# Patient Record
Sex: Female | Born: 1990
Health system: Southern US, Community
[De-identification: ages and names within clinical notes are randomized; demographics above are authoritative.]

## PROBLEM LIST (undated history)

## (undated) DIAGNOSIS — F909 Attention-deficit hyperactivity disorder, unspecified type: Secondary | ICD-10-CM

## (undated) DIAGNOSIS — R102 Pelvic and perineal pain: Secondary | ICD-10-CM

## (undated) DIAGNOSIS — K219 Gastro-esophageal reflux disease without esophagitis: Secondary | ICD-10-CM

## (undated) DIAGNOSIS — F419 Anxiety disorder, unspecified: Secondary | ICD-10-CM

## (undated) DIAGNOSIS — Z8719 Personal history of other diseases of the digestive system: Secondary | ICD-10-CM

## (undated) DIAGNOSIS — N809 Endometriosis, unspecified: Secondary | ICD-10-CM

## (undated) DIAGNOSIS — O09299 Supervision of pregnancy with other poor reproductive or obstetric history, unspecified trimester: Secondary | ICD-10-CM

## (undated) DIAGNOSIS — L309 Dermatitis, unspecified: Secondary | ICD-10-CM

## (undated) DIAGNOSIS — Z8711 Personal history of peptic ulcer disease: Secondary | ICD-10-CM

## (undated) DIAGNOSIS — N83209 Unspecified ovarian cyst, unspecified side: Secondary | ICD-10-CM

## (undated) HISTORY — DX: Anxiety disorder, unspecified: F41.9

## (undated) HISTORY — DX: Personal history of peptic ulcer disease: Z87.11

## (undated) HISTORY — DX: Attention-deficit hyperactivity disorder, unspecified type: F90.9

## (undated) HISTORY — PX: WISDOM TOOTH EXTRACTION: SHX21

## (undated) HISTORY — PX: LAPAROSCOPIC CHOLECYSTECTOMY: SUR755

## (undated) HISTORY — DX: Personal history of other diseases of the digestive system: Z87.19

---

## 2015-12-13 ENCOUNTER — Encounter: Payer: Self-pay | Admitting: Obstetrics and Gynecology

## 2015-12-19 ENCOUNTER — Encounter: Payer: Self-pay | Admitting: Obstetrics and Gynecology

## 2015-12-19 ENCOUNTER — Ambulatory Visit (INDEPENDENT_AMBULATORY_CARE_PROVIDER_SITE_OTHER): Payer: Commercial Managed Care - HMO | Admitting: Obstetrics and Gynecology

## 2015-12-19 VITALS — BP 118/70 | HR 80 | Resp 16 | Ht 67.0 in | Wt 148.0 lb

## 2015-12-19 DIAGNOSIS — Z113 Encounter for screening for infections with a predominantly sexual mode of transmission: Secondary | ICD-10-CM

## 2015-12-19 DIAGNOSIS — Z3009 Encounter for other general counseling and advice on contraception: Secondary | ICD-10-CM | POA: Diagnosis not present

## 2015-12-19 DIAGNOSIS — F419 Anxiety disorder, unspecified: Secondary | ICD-10-CM | POA: Insufficient documentation

## 2015-12-19 DIAGNOSIS — Z0000 Encounter for general adult medical examination without abnormal findings: Secondary | ICD-10-CM | POA: Diagnosis not present

## 2015-12-19 DIAGNOSIS — Z01419 Encounter for gynecological examination (general) (routine) without abnormal findings: Secondary | ICD-10-CM | POA: Diagnosis not present

## 2015-12-19 DIAGNOSIS — Z124 Encounter for screening for malignant neoplasm of cervix: Secondary | ICD-10-CM

## 2015-12-19 LAB — CBC
HEMATOCRIT: 41.4 % (ref 35.0–45.0)
HEMOGLOBIN: 13.9 g/dL (ref 11.7–15.5)
MCH: 31.9 pg (ref 27.0–33.0)
MCHC: 33.6 g/dL (ref 32.0–36.0)
MCV: 95 fL (ref 80.0–100.0)
MPV: 10.5 fL (ref 7.5–12.5)
Platelets: 235 10*3/uL (ref 140–400)
RBC: 4.36 MIL/uL (ref 3.80–5.10)
RDW: 13.3 % (ref 11.0–15.0)
WBC: 8.9 10*3/uL (ref 3.8–10.8)

## 2015-12-19 MED ORDER — NAPROXEN SODIUM 550 MG PO TABS: 550.0000 mg | ORAL_TABLET | Freq: Two times a day (BID) | ORAL | 2 refills | Status: DC

## 2015-12-19 MED ORDER — NORGESTIM-ETH ESTRAD TRIPHASIC 0.18/0.215/0.25 MG-25 MCG PO TABS: 1.0000 | ORAL_TABLET | Freq: Every day | ORAL | 3 refills | Status: DC

## 2015-12-19 NOTE — Progress Notes (Addendum)
25 y.o. G0P0000 MarriedCaucasianF here for annual exam.  She is interested in contraception. She did well on OTC-Lo in the past. She ran out about 6 months ago.  Period Cycle (Days): 28 Period Duration (Days): 3-4 days  Period Pattern: Regular Menstrual Flow: Moderate Menstrual Control: Tampon Dysmenorrhea: (!) Moderate Dysmenorrhea Symptoms: Cramping  Saturates a super tampon in 4-6 hours at times. Cramps are helped with midol.  She c/o intermittent pain in her left breast for the last year. All in the upper outer side, not cyclic. Can last for several hours at a time. She drinks caffeine, 2 cups of coffee a day (10 oz each)   Patient's last menstrual period was 12/10/2015.          Sexually active: Yes.    The current method of family planning is none.    Exercising: Yes.    plyometrics weights  Smoker:  Former smoker  Health Maintenance: Pap:  2015 History of abnormal Pap:  no MMG:  Never Colonoscopy:  2016 WNL per patient  BMD:   Never TDaP:  2015 Gardasil: no    reports that she has quit smoking. Her smoking use included Cigarettes. She has a 2.50 pack-year smoking history. She has never used smokeless tobacco. She reports that she drinks about 1.2 oz of alcohol per week . 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 She is applying to nursing school.   Past Medical History:  Diagnosis Date  . Anxiety   . History of gastric ulcer     Past Surgical History:  Procedure Laterality Date  . CHOLECYSTECTOMY      Current Outpatient Prescriptions  Medication Sig Dispense Refill  . Crisaborole (EUCRISA) 2 % OINT Apply topically 2 (two) times daily.    Marland Kitchen loratadine (CLARITIN) 10 MG tablet Take 10 mg by mouth daily.     No current facility-administered medications for this visit.     Family History  Problem Relation Age of Onset  . Hypertension Mother   . Hyperlipidemia Father   . Heart disease Maternal Grandmother     Review of  Systems  Constitutional: Negative.   HENT: Negative.   Eyes: Negative.   Respiratory: Negative.   Cardiovascular: Negative.   Gastrointestinal: Negative.   Endocrine: Negative.   Genitourinary: Negative.        Left breast pain   Musculoskeletal: Negative.   Skin: Negative.   Allergic/Immunologic: Negative.   Neurological: Negative.   Psychiatric/Behavioral: Negative.     Exam:   BP 118/70 (BP Location: Right Arm, Patient Position: Sitting, Cuff Size: Normal)   Pulse 80   Resp 16   Ht 5\' 7"  (1.702 m)   Wt 148 lb (67.1 kg)   LMP 12/10/2015   BMI 23.18 kg/m   Weight change: @WEIGHTCHANGE @ Height:   Height: 5\' 7"  (170.2 cm)  Ht Readings from Last 3 Encounters:  12/19/15 5\' 7"  (1.702 m)    General appearance: alert, cooperative and appears stated age Head: Normocephalic, without obvious abnormality, atraumatic Neck: no adenopathy, supple, symmetrical, trachea midline and thyroid normal to inspection and palpation Lungs: clear to auscultation bilaterally Breasts: normal appearance, no masses or tenderness Heart: regular rate and rhythm Abdomen: soft, non-tender; bowel sounds normal; no masses,  no organomegaly Extremities: extremities normal, atraumatic, no cyanosis or edema Skin: Skin color, texture, turgor normal. No rashes or lesions Lymph nodes: Cervical, supraclavicular, and axillary nodes normal. No abnormal inguinal nodes palpated Neurologic: Grossly normal  Pelvic: External genitalia:  no lesions              Urethra:  normal appearing urethra with no masses, tenderness or lesions              Bartholins and Skenes: normal                 Vagina: normal appearing vagina with normal color and discharge, no lesions              Cervix: no lesions               Bimanual Exam:  Uterus:  normal size, contour, position, consistency, mobility, non-tender, anteverted              Adnexa: no mass, fullness, tenderness               Rectovaginal: Confirms                Anus:  normal sphincter tone, no lesions  Chaperone was present for exam.  A:  Well Woman with normal exam  Contraception   Dysmenorrhea   Screening for STD  P:   Pap with hpv  Start OCP's, no contraindications, risks reviewed   Anaprox DS for dysmorrhea  Discussed breast self exam  Discussed calcium and vit D intake  Labs done     Addendum: left sided breast pain, normal exam. Advised her to stop caffeine, if breast pain persists she will call and we will set up a breast ultrasound for her.

## 2015-12-19 NOTE — Patient Instructions (Signed)
EXERCISE AND DIET:  We recommended that you start or continue a regular exercise program for good health. Regular exercise means any activity that makes your heart beat faster and makes you sweat.  We recommend exercising at least 30 minutes per day at least 3 days a week, preferably 4 or 5.  We also recommend a diet low in fat and sugar.  Inactivity, poor dietary choices and obesity can cause diabetes, heart attack, stroke, and kidney damage, among others.    ALCOHOL AND SMOKING:  Women should limit their alcohol intake to no more than 7 drinks/beers/glasses of wine (combined, not each!) per week. Moderation of alcohol intake to this level decreases your risk of breast cancer and liver damage. And of course, no recreational drugs are part of a healthy lifestyle.  And absolutely no smoking or even second hand smoke. Most people know smoking can cause heart and lung diseases, but did you know it also contributes to weakening of your bones? Aging of your skin?  Yellowing of your teeth and nails?  CALCIUM AND VITAMIN D:  Adequate intake of calcium and Vitamin D are recommended.  The recommendations for exact amounts of these supplements seem to change often, but generally speaking 600 mg of calcium (either carbonate or citrate) and 800 units of Vitamin D per day seems prudent. Certain women may benefit from higher intake of Vitamin D.  If you are among these women, your doctor will have told you during your visit.    PAP SMEARS:  Pap smears, to check for cervical cancer or precancers,  have traditionally been done yearly, although recent scientific advances have shown that most women can have pap smears less often.  However, every woman still should have a physical exam from her gynecologist every year. It will include a breast check, inspection of the vulva and vagina to check for abnormal growths or skin changes, a visual exam of the cervix, and then an exam to evaluate the size and shape of the uterus and  ovaries.  And after 26 years of age, a rectal exam is indicated to check for rectal cancers. We will also provide age appropriate advice regarding health maintenance, like when you should have certain vaccines, screening for sexually transmitted diseases, bone density testing, colonoscopy, mammograms, etc.   MAMMOGRAMS:  All women over 40 years old should have a yearly mammogram. Many facilities now offer a "3D" mammogram, which may cost around $50 extra out of pocket. If possible,  we recommend you accept the option to have the 3D mammogram performed.  It both reduces the number of women who will be called back for extra views which then turn out to be normal, and it is better than the routine mammogram at detecting truly abnormal areas.    COLONOSCOPY:  Colonoscopy to screen for colon cancer is recommended for all women at age 50.  We know, you hate the idea of the prep.  We agree, BUT, having colon cancer and not knowing it is worse!!  Colon cancer so often starts as a polyp that can be seen and removed at colonscopy, which can quite literally save your life!  And if your first colonoscopy is normal and you have no family history of colon cancer, most women don't have to have it again for 10 years.  Once every ten years, you can do something that may end up saving your life, right?  We will be happy to help you get it scheduled when you are ready.    Be sure to check your insurance coverage so you understand how much it will cost.  It may be covered as a preventative service at no cost, but you should check your particular policy.     Oral Contraception Information Oral contraceptive pills (OCPs) are medicines taken to prevent pregnancy. OCPs work by preventing the ovaries from releasing eggs. The hormones in OCPs also cause the cervical mucus to thicken, preventing the sperm from entering the uterus. The hormones also cause the uterine lining to become thin, not allowing a fertilized egg to attach to the  inside of the uterus. OCPs are highly effective when taken exactly as prescribed. However, OCPs do not prevent sexually transmitted diseases (STDs). Safe sex practices, such as using condoms along with the pill, can help prevent STDs.  Before taking the pill, you may have a physical exam and Pap test. Your health care provider may order blood tests. The health care provider will make sure you are a good candidate for oral contraception. Discuss with your health care provider the possible side effects of the OCP you may be prescribed. When starting an OCP, it can take 2 to 3 months for the body to adjust to the changes in hormone levels in your body.  TYPES OF ORAL CONTRACEPTION  The combination pill--This pill contains estrogen and progestin (synthetic progesterone) hormones. The combination pill comes in 21-day, 28-day, or 91-day packs. Some types of combination pills are meant to be taken continuously (365-day pills). With 21-day packs, you do not take pills for 7 days after the last pill. With 28-day packs, the pill is taken every day. The last 7 pills are without hormones. Certain types of pills have more than 21 hormone-containing pills. With 91-day packs, the first 84 pills contain both hormones, and the last 7 pills contain no hormones or contain estrogen only.  The minipill--This pill contains the progesterone hormone only. The pill is taken every day continuously. It is very important to take the pill at the same time each day. The minipill comes in packs of 28 pills. All 28 pills contain the hormone.  ADVANTAGES OF ORAL CONTRACEPTIVE PILLS  Decreases premenstrual symptoms.   Treats menstrual period cramps.   Regulates the menstrual cycle.   Decreases a heavy menstrual flow.   May treatacne, depending on the type of pill.   Treats abnormal uterine bleeding.   Treats polycystic ovarian syndrome.   Treats endometriosis.   Can be used as emergency contraception.  THINGS  THAT CAN MAKE ORAL CONTRACEPTIVE PILLS LESS EFFECTIVE OCPs can be less effective if:   You forget to take the pill at the same time every day.   You have a stomach or intestinal disease that lessens the absorption of the pill.   You take OCPs with other medicines that make OCPs less effective, such as antibiotics, certain HIV medicines, and some seizure medicines.   You take expired OCPs.   You forget to restart the pill on day 7, when using the packs of 21 pills.  RISKS ASSOCIATED WITH ORAL CONTRACEPTIVE PILLS  Oral contraceptive pills can sometimes cause side effects, such as:  Headache.  Nausea.  Breast tenderness.  Irregular bleeding or spotting. Combination pills are also associated with a small increased risk of:  Blood clots.  Heart attack.  Stroke.   This information is not intended to replace advice given to you by your health care provider. Make sure you discuss any questions you have with your health care provider.  Document Released: 07/28/2002 Document Revised: 02/25/2013 Document Reviewed: 10/26/2012 [employer] Interactive Patient Education [employer].

## 2015-12-20 LAB — COMPREHENSIVE METABOLIC PANEL
ALBUMIN: 4.5 g/dL (ref 3.6–5.1)
ALT: 12 U/L (ref 6–29)
AST: 16 U/L (ref 10–30)
Alkaline Phosphatase: 72 U/L (ref 33–115)
BUN: 12 mg/dL (ref 7–25)
CALCIUM: 9.7 mg/dL (ref 8.6–10.2)
CHLORIDE: 104 mmol/L (ref 98–110)
CO2: 26 mmol/L (ref 20–31)
Creat: 0.67 mg/dL (ref 0.50–1.10)
Glucose, Bld: 87 mg/dL (ref 65–99)
POTASSIUM: 4.5 mmol/L (ref 3.5–5.3)
Sodium: 140 mmol/L (ref 135–146)
TOTAL PROTEIN: 6.9 g/dL (ref 6.1–8.1)
Total Bilirubin: 0.5 mg/dL (ref 0.2–1.2)

## 2015-12-20 LAB — IPS PAP TEST WITH REFLEX TO HPV

## 2015-12-20 LAB — STD PANEL
HEP B S AG: NEGATIVE
HIV 1&2 Ab, 4th Generation: NONREACTIVE

## 2015-12-20 LAB — LIPID PANEL
CHOLESTEROL: 147 mg/dL (ref 125–200)
HDL: 60 mg/dL (ref >=46)
LDL CALC: 55 mg/dL (ref <130)
TRIGLYCERIDES: 159 mg/dL — AB (ref <150)
Total CHOL/HDL Ratio: 2.5 Ratio (ref <=5.0)
VLDL: 32 mg/dL — AB (ref <30)

## 2015-12-20 LAB — HEPATITIS C ANTIBODY: HCV AB: NEGATIVE

## 2015-12-20 LAB — VITAMIN D 25 HYDROXY (VIT D DEFICIENCY, FRACTURES): Vit D, 25-Hydroxy: 27 ng/mL — ABNORMAL LOW (ref 30–100)

## 2015-12-21 LAB — IPS N GONORRHOEA AND CHLAMYDIA BY PCR

## 2016-05-07 ENCOUNTER — Encounter (HOSPITAL_COMMUNITY): Payer: Self-pay | Admitting: Emergency Medicine

## 2016-05-07 ENCOUNTER — Emergency Department (HOSPITAL_COMMUNITY): Payer: Commercial Managed Care - HMO

## 2016-05-07 ENCOUNTER — Emergency Department (HOSPITAL_COMMUNITY)
Admission: EM | Admit: 2016-05-07 | Discharge: 2016-05-07 | Disposition: A | Discharge status: Home / Self Care | Payer: Commercial Managed Care - HMO | Attending: Emergency Medicine | Admitting: Emergency Medicine

## 2016-05-07 DIAGNOSIS — Z87891 Personal history of nicotine dependence: Secondary | ICD-10-CM | POA: Diagnosis not present

## 2016-05-07 DIAGNOSIS — R1030 Lower abdominal pain, unspecified: Secondary | ICD-10-CM | POA: Diagnosis present

## 2016-05-07 DIAGNOSIS — F909 Attention-deficit hyperactivity disorder, unspecified type: Secondary | ICD-10-CM | POA: Insufficient documentation

## 2016-05-07 LAB — CBC WITH DIFFERENTIAL/PLATELET
BASOS PCT: 0 %
Basophils Absolute: 0 10*3/uL (ref 0.0–0.1)
EOS ABS: 0.1 10*3/uL (ref 0.0–0.7)
Eosinophils Relative: 1 %
HCT: 38.7 % (ref 36.0–46.0)
Hemoglobin: 13.5 g/dL (ref 12.0–15.0)
Lymphocytes Relative: 26 %
Lymphs Abs: 1.8 10*3/uL (ref 0.7–4.0)
MCH: 31.1 pg (ref 26.0–34.0)
MCHC: 34.9 g/dL (ref 30.0–36.0)
MCV: 89.2 fL (ref 78.0–100.0)
MONO ABS: 0.6 10*3/uL (ref 0.1–1.0)
MONOS PCT: 8 %
NEUTROS PCT: 65 %
Neutro Abs: 4.4 10*3/uL (ref 1.7–7.7)
PLATELETS: 197 10*3/uL (ref 150–400)
RBC: 4.34 MIL/uL (ref 3.87–5.11)
RDW: 12.4 % (ref 11.5–15.5)
WBC: 6.9 10*3/uL (ref 4.0–10.5)

## 2016-05-07 LAB — URINALYSIS, ROUTINE W REFLEX MICROSCOPIC
Bilirubin Urine: NEGATIVE
GLUCOSE, UA: NEGATIVE mg/dL
HGB URINE DIPSTICK: NEGATIVE
Ketones, ur: NEGATIVE mg/dL
NITRITE: NEGATIVE
Protein, ur: NEGATIVE mg/dL
SPECIFIC GRAVITY, URINE: 1.014 (ref 1.005–1.030)
pH: 8 (ref 5.0–8.0)

## 2016-05-07 LAB — COMPREHENSIVE METABOLIC PANEL
ALBUMIN: 3.6 g/dL (ref 3.5–5.0)
ALK PHOS: 56 U/L (ref 38–126)
ALT: 16 U/L (ref 14–54)
ANION GAP: 8 (ref 5–15)
AST: 20 U/L (ref 15–41)
BILIRUBIN TOTAL: 0.5 mg/dL (ref 0.3–1.2)
BUN: 11 mg/dL (ref 6–20)
CALCIUM: 8.9 mg/dL (ref 8.9–10.3)
CO2: 25 mmol/L (ref 22–32)
Chloride: 104 mmol/L (ref 101–111)
Creatinine, Ser: 0.61 mg/dL (ref 0.44–1.00)
GFR calc Af Amer: >60 mL/min (ref >60)
GLUCOSE: 97 mg/dL (ref 65–99)
Potassium: 3.9 mmol/L (ref 3.5–5.1)
Sodium: 137 mmol/L (ref 135–145)
TOTAL PROTEIN: 6.4 g/dL — AB (ref 6.5–8.1)

## 2016-05-07 LAB — I-STAT BETA HCG BLOOD, ED (MC, WL, AP ONLY)

## 2016-05-07 LAB — LIPASE, BLOOD: Lipase: 17 U/L (ref 11–51)

## 2016-05-07 MED ORDER — MORPHINE SULFATE (PF) 4 MG/ML IV SOLN
4.0000 mg | Freq: Once | INTRAVENOUS | Status: AC
  Administered 2016-05-07: 4 mg via INTRAVENOUS
  Filled 2016-05-07: qty 1

## 2016-05-07 MED ORDER — ONDANSETRON HCL 4 MG/2ML IJ SOLN
4.0000 mg | Freq: Once | INTRAMUSCULAR | Status: AC
  Administered 2016-05-07: 4 mg via INTRAVENOUS
  Filled 2016-05-07: qty 2

## 2016-05-07 NOTE — ED Provider Notes (Signed)
Emergency Department Provider Note   I have reviewed the triage vital signs and the nursing notes.   HISTORY  Chief Complaint Abdominal Pain   HPI Joyce Massey is a 25 y.o. female with PMH of anxiety, ADHD, and ovarian cysts presents to the emergency department for evaluation of sudden onset lower abdominal pain. She describes the pain as distributed across her bilateral lower abdomen. No one side greater than the other. She intermittently has sudden, severe sharp stabbing pains but also occur bilaterally. She denies any vaginal bleeding or discharge. Her last missed her period was one week prior she has been compliant with her oral contraceptive medication. She is very low suspicion for pregnancy or sexually transmitted infection. No associated diarrhea, constipation, nausea, vomiting. She's had no fevers. She ports this feels similar to ovarian cyst pain that she's had in the past but is more severe. No exacerbating or alleviating factors. She denies urinary tract infection symptoms.   Past Medical History:  Diagnosis Date  . ADHD (attention deficit hyperactivity disorder)   . Anxiety   . History of gastric ulcer     Patient Active Problem List   Diagnosis Date Noted  . Anxiety     Past Surgical History:  Procedure Laterality Date  . CHOLECYSTECTOMY    . CHOLECYSTECTOMY      Current Outpatient Rx  . Order #: [id number] Class: Historical Med  . Order #: [id number] Class: Historical Med  . Order #: [id number] Class: Historical Med  . Order #: [id number] Class: Historical Med  . Order #: [id number] Class: Print  . Order #: [id number] Class: Print    Allergies Gabapentin; Mobic [meloxicam]; Latex; and Tape  Family History  Problem Relation Age of Onset  . Hypertension Mother   . Hyperlipidemia Father   . Heart disease Maternal Grandmother     Social History Social History  Substance Use Topics  . Smoking status: Former Smoker    Packs/day: 0.50    Years: 5.00   Types: Cigarettes  . Smokeless tobacco: Never Used  . Alcohol use 1.2 oz/week    2 Standard drinks or equivalent per week    Review of Systems  Constitutional: No fever/chills Eyes: No visual changes. ENT: No sore throat. Cardiovascular: Denies chest pain. Respiratory: Denies shortness of breath. Gastrointestinal: Positive lower abdominal pain.  No nausea, no vomiting.  No diarrhea.  No constipation. Genitourinary: Negative for dysuria. Musculoskeletal: Negative for back pain. Skin: Negative for rash. Neurological: Negative for headaches, focal weakness or numbness.  10-point ROS otherwise negative.  ____________________________________________   PHYSICAL EXAM:  VITAL SIGNS: ED Triage Vitals  Enc Vitals Group     BP 05/07/16 0707 123/80     Pulse Rate 05/07/16 0707 75     Resp 05/07/16 0707 20     Temp 05/07/16 0707 98.5 F (36.9 C)     Temp Source 05/07/16 0707 Oral     SpO2 05/07/16 0707 100 %     Weight 05/07/16 0703 150 lb (68 kg)     Height 05/07/16 0703 5\' 7"  (1.702 m)     Pain Score 05/07/16 0703 8   Constitutional: Alert and oriented. Well appearing and in no acute distress. Eyes: Conjunctivae are normal.  Head: Atraumatic. Nose: No congestion/rhinnorhea. Mouth/Throat: Mucous membranes are moist.  Oropharynx non-erythematous. Neck: No stridor.  Cardiovascular: Normal rate, regular rhythm. Good peripheral circulation. Grossly normal heart sounds.   Respiratory: Normal respiratory effort.  No retractions. Lungs CTAB. Gastrointestinal: Soft with bilateral lower abdominal tenderness. No rebound or guarding.  No distention.  Musculoskeletal: No lower extremity tenderness nor edema. No gross deformities of extremities. Neurologic:  Normal speech and language. No gross focal neurologic deficits are appreciated.  Skin:  Skin is warm, dry and intact. No rash noted.  ____________________________________________   LABS (all labs ordered are listed, but only  abnormal results are displayed)  Labs Reviewed  COMPREHENSIVE METABOLIC PANEL - Abnormal; Notable for the following:       Result Value   Total Protein 6.4 (*)    All other components within normal limits  URINALYSIS, ROUTINE W REFLEX MICROSCOPIC - Abnormal; Notable for the following:    APPearance HAZY (*)    Leukocytes, UA MODERATE (*)    Bacteria, UA RARE (*)    Squamous Epithelial / LPF 6-30 (*)    All other components within normal limits  LIPASE, BLOOD  CBC WITH DIFFERENTIAL/PLATELET  I-STAT BETA HCG BLOOD, ED ([facility], [facility], [facility] ONLY)   ____________________________________________  RADIOLOGY  US Transvaginal Non-ob  Result Date: 05/07/2016 CLINICAL DATA:  Left lower quadrant pain starting this morning. History of ovarian cysts. EXAM: TRANSABDOMINAL AND TRANSVAGINAL ULTRASOUND OF PELVIS DOPPLER ULTRASOUND OF OVARIES TECHNIQUE: Both transabdominal and transvaginal ultrasound examinations of the pelvis were performed. Transabdominal technique was performed for global imaging of the pelvis including uterus, ovaries, adnexal regions, and pelvic cul-de-sac. It was necessary to proceed with endovaginal exam following the transabdominal exam to visualize the left ovary. Color and duplex Doppler ultrasound was utilized to evaluate blood flow to the ovaries. COMPARISON:  None. FINDINGS: Uterus Measurements: 7.7 by 3.3 by 4.7 cm. No fibroids or other mass visualized. Endometrium Thickness: 0.4 cm.  No focal abnormality visualized. Right ovary Measurements: 2.9 by 2.0 by 2.6 cm. Normal appearance/no adnexal mass. Left ovary Measurements: 5.0 by 3.1 by 2.7 cm. The left ovary appears to contain an isoechoic complex 3.0 by 2.6 by 2.4 cm masslike region, this lesion internally has questionable if any internal blood flow, compared to the parenchyma of the ovary. Pulsed Doppler evaluation of both ovaries demonstrates normal low-resistance arterial and venous waveforms. Other findings Small to moderate amount of  free pelvic fluid particularly around the left ovary. Questionable mild complexity. Tenderness along the left adnexa. IMPRESSION: 1. No findings of torsed ovary. 2. 3 cm complex isoechoic lesion in the left ovary, questionable if any internal blood flow within this lesion itself, could be a complex cyst or corpus luteum, less likely an abscess (particularly given the lack of leukocytosis). There is a small to moderate but abnormal amount of free pelvic fluid partially centered around the left ovary, blood products not excluded. I note that the patient has a negative beta HCG test and accordingly ectopic pregnancy is essentially ruled out. Given the possible mild hemoperitoneum, monitoring for hemodynamic stability may be warranted. Assuming a stabilizing clinical course with conservative therapy, consider followup sonography in 6 weeks time in order to reassess the left ovary and ensure resolution of the lesion. Electronically Signed   By: Van Clines M.D.   On: 05/07/2016 08:53   US Pelvis Complete  Result Date: 05/07/2016 CLINICAL DATA:  Left lower quadrant pain starting this morning. History of ovarian cysts. EXAM: TRANSABDOMINAL AND TRANSVAGINAL ULTRASOUND OF PELVIS DOPPLER ULTRASOUND OF OVARIES TECHNIQUE: Both transabdominal and transvaginal ultrasound examinations of the pelvis were performed. Transabdominal technique was performed for global imaging of the pelvis including uterus, ovaries, adnexal regions, and pelvic cul-de-sac. It was necessary to proceed with endovaginal exam following the transabdominal exam to visualize the left  ovary. Color and duplex Doppler ultrasound was utilized to evaluate blood flow to the ovaries. COMPARISON:  None. FINDINGS: Uterus Measurements: 7.7 by 3.3 by 4.7 cm. No fibroids or other mass visualized. Endometrium Thickness: 0.4 cm.  No focal abnormality visualized. Right ovary Measurements: 2.9 by 2.0 by 2.6 cm. Normal appearance/no adnexal mass. Left ovary  Measurements: 5.0 by 3.1 by 2.7 cm. The left ovary appears to contain an isoechoic complex 3.0 by 2.6 by 2.4 cm masslike region, this lesion internally has questionable if any internal blood flow, compared to the parenchyma of the ovary. Pulsed Doppler evaluation of both ovaries demonstrates normal low-resistance arterial and venous waveforms. Other findings Small to moderate amount of free pelvic fluid particularly around the left ovary. Questionable mild complexity. Tenderness along the left adnexa. IMPRESSION: 1. No findings of torsed ovary. 2. 3 cm complex isoechoic lesion in the left ovary, questionable if any internal blood flow within this lesion itself, could be a complex cyst or corpus luteum, less likely an abscess (particularly given the lack of leukocytosis). There is a small to moderate but abnormal amount of free pelvic fluid partially centered around the left ovary, blood products not excluded. I note that the patient has a negative beta HCG test and accordingly ectopic pregnancy is essentially ruled out. Given the possible mild hemoperitoneum, monitoring for hemodynamic stability may be warranted. Assuming a stabilizing clinical course with conservative therapy, consider followup sonography in 6 weeks time in order to reassess the left ovary and ensure resolution of the lesion. Electronically Signed   By: Van Clines M.D.   On: 05/07/2016 08:53   [place] Art/ven Flow Abd Pelv Doppler  Result Date: 05/07/2016 CLINICAL DATA:  Left lower quadrant pain starting this morning. History of ovarian cysts. EXAM: TRANSABDOMINAL AND TRANSVAGINAL ULTRASOUND OF PELVIS DOPPLER ULTRASOUND OF OVARIES TECHNIQUE: Both transabdominal and transvaginal ultrasound examinations of the pelvis were performed. Transabdominal technique was performed for global imaging of the pelvis including uterus, ovaries, adnexal regions, and pelvic cul-de-sac. It was necessary to proceed with endovaginal exam following the  transabdominal exam to visualize the left ovary. Color and duplex Doppler ultrasound was utilized to evaluate blood flow to the ovaries. COMPARISON:  None. FINDINGS: Uterus Measurements: 7.7 by 3.3 by 4.7 cm. No fibroids or other mass visualized. Endometrium Thickness: 0.4 cm.  No focal abnormality visualized. Right ovary Measurements: 2.9 by 2.0 by 2.6 cm. Normal appearance/no adnexal mass. Left ovary Measurements: 5.0 by 3.1 by 2.7 cm. The left ovary appears to contain an isoechoic complex 3.0 by 2.6 by 2.4 cm masslike region, this lesion internally has questionable if any internal blood flow, compared to the parenchyma of the ovary. Pulsed Doppler evaluation of both ovaries demonstrates normal low-resistance arterial and venous waveforms. Other findings Small to moderate amount of free pelvic fluid particularly around the left ovary. Questionable mild complexity. Tenderness along the left adnexa. IMPRESSION: 1. No findings of torsed ovary. 2. 3 cm complex isoechoic lesion in the left ovary, questionable if any internal blood flow within this lesion itself, could be a complex cyst or corpus luteum, less likely an abscess (particularly given the lack of leukocytosis). There is a small to moderate but abnormal amount of free pelvic fluid partially centered around the left ovary, blood products not excluded. I note that the patient has a negative beta HCG test and accordingly ectopic pregnancy is essentially ruled out. Given the possible mild hemoperitoneum, monitoring for hemodynamic stability may be warranted. Assuming a stabilizing clinical course with conservative  therapy, consider followup sonography in 6 weeks time in order to reassess the left ovary and ensure resolution of the lesion. Electronically Signed   By: Van Clines M.D.   On: 05/07/2016 08:53    ____________________________________________   PROCEDURES  Procedure(s) performed:    Procedures  None ____________________________________________   INITIAL IMPRESSION / ASSESSMENT AND PLAN / ED COURSE  Pertinent labs & imaging results that were available during my care of the patient were reviewed by me and considered in my medical decision making (see chart for details).  Patient resents emergency pertinent for evaluation of intermittent lower abdominal discomfort. On my initial exam the patient appears uncomfortable and has lower abdominal tenderness. She has a history of ovarian cysts. My suspicion for ovarian torsion is somewhat elevated. Plan for Doppler ultrasound of the pelvis, labs, and pain control.   Differential diagnosis includes but is not exclusive to ectopic pregnancy, ovarian cyst, ovarian torsion, acute appendicitis, urinary tract infection, endometriosis, bowel obstruction, hernia, colitis, renal colic, gastroenteritis, volvulus etc.  09:51 AM Spoke with GYN Dr. Nehemiah Settle by phone who reviewed the images. No symptoms at this time. No evidence of acute torsion. Notes that amount of fluid seems appropriate. Plan for OB/Gyn follow up with them in the next 48 hours and strict torsion return precautions to [facility].   At this time, I do not feel there is any life-threatening condition present. I have reviewed and discussed all results (EKG, imaging, lab, urine as appropriate), exam findings with patient. I have reviewed nursing notes and appropriate previous records.  I feel the patient is safe to be discharged home without further emergent workup. Discussed usual and customary return precautions. Patient and family (if present) verbalize understanding and are comfortable with this plan.  Patient will follow-up with their primary care provider. If they do not have a primary care provider, information for follow-up has been provided to them. All questions have been answered.  ____________________________________________  FINAL CLINICAL IMPRESSION(S) / ED  DIAGNOSES  Final diagnoses:  Lower abdominal pain     MEDICATIONS GIVEN DURING THIS VISIT:  Medications  morphine 4 MG/ML injection 4 mg (4 mg Intravenous Given 05/07/16 0911)  ondansetron (ZOFRAN) injection 4 mg (4 mg Intravenous Given 05/07/16 0910)     NEW OUTPATIENT MEDICATIONS STARTED DURING THIS VISIT:  None   Note:  This document was prepared using Dragon voice recognition software and may include unintentional dictation errors.  Nanda Quinton, MD Emergency Medicine   Margette Fast, MD 05/07/16 2025

## 2016-05-07 NOTE — ED Notes (Signed)
Patient transported to Ultrasound 

## 2016-05-07 NOTE — ED Triage Notes (Signed)
Pt c/o lower abdominal pain/cramping beginning this am. Pt denies urinary symptoms. States she has a hx of ovarian cysts.

## 2016-05-07 NOTE — Discharge Instructions (Signed)
You have been seen in the Emergency Department (ED) for abdominal pain.  We believe your pain is mostly likely caused by a ruptured ovarian cyst, which is generally a benign but potentially painful process.  Please read through the included information and follow up as instructed above regarding today?s emergent visit and the symptoms that are bothering you.  Take over-the-counter ibuprofen and Tylenol as needed for pain control (unless your doctor has told you in the past to avoid either of these medications).  Return to the ED if your abdominal pain worsens or fails to improve, you develop bloody vomiting, bloody diarrhea, you are unable to tolerate fluids due to vomiting, fever greater than 101, or other symptoms that concern you.

## 2016-05-24 ENCOUNTER — Telehealth: Payer: Self-pay | Admitting: Obstetrics and Gynecology

## 2016-05-28 ENCOUNTER — Telehealth: Payer: Self-pay | Admitting: *Deleted

## 2016-05-28 NOTE — Telephone Encounter (Signed)
Would close encounter.  She has an appointment for an annual exam this summer. Will send to nursing supervisor

## 2016-05-28 NOTE — Telephone Encounter (Signed)
Spoke with patient. Advised patient was calling to f/u with scheduling  f/u appointment with Dr. Talbert Nan as recommended from 05/07/16 ED visit. Patient declined to schedule appointment. Patient states she has already seen another OB/Gyn and does not wish to discuss further. Advised patient will update Dr. Talbert Nan.  Dr. Talbert Nan, please advise?  Cc: Lamont Snowball

## 2016-06-21 DIAGNOSIS — L309 Dermatitis, unspecified: Secondary | ICD-10-CM | POA: Insufficient documentation

## 2016-06-21 DIAGNOSIS — R102 Pelvic and perineal pain: Secondary | ICD-10-CM | POA: Diagnosis not present

## 2016-06-21 DIAGNOSIS — Z91011 Allergy to milk products: Secondary | ICD-10-CM | POA: Insufficient documentation

## 2016-06-21 DIAGNOSIS — R1909 Other intra-abdominal and pelvic swelling, mass and lump: Secondary | ICD-10-CM | POA: Diagnosis not present

## 2016-06-25 ENCOUNTER — Telehealth: Payer: Self-pay | Admitting: *Deleted

## 2016-06-25 NOTE — Telephone Encounter (Signed)
Call to patient. Per ROI can leave message on [phone].  Left message this was follow-up phone call after her last call to office. Left message to call back to clinical supervisor, Gay Filler.    Was seen in ED and follow-up visit was recommended. Message was sent to our office from ED.  We contacted her to follow-up and patient declined, stated she was seeing another provider.  Patient has still has annual scheduled. Calling to check status.

## 2016-06-28 NOTE — Telephone Encounter (Signed)
Follow-up call to patient. Advised calling to check on patient experience from last phone encounter and need for recheck after ED visit. Patient states ED told her she should see MD that can do OB care and since we are GYN only she just made appointment with new practice. She is doing better but still has a cyst. Had an ultrasound on 2-1 and is scheduled for repeat in Joyce Massey. Advised her annual is due and scheduled in August and patient states she plans to continue/transfer care to new OB provider since desire to proceed with pregnancy soon. States nothing was wrong here, just desires to be with OB practice. Wished patient well and call back if has future needs.  Routing to provider for final review. Patient agreeable to disposition. Will close encounter.

## 2016-06-29 DIAGNOSIS — R1909 Other intra-abdominal and pelvic swelling, mass and lump: Secondary | ICD-10-CM | POA: Diagnosis not present

## 2016-06-29 DIAGNOSIS — R110 Nausea: Secondary | ICD-10-CM | POA: Diagnosis not present

## 2016-06-29 DIAGNOSIS — R102 Pelvic and perineal pain: Secondary | ICD-10-CM | POA: Diagnosis not present

## 2016-07-06 ENCOUNTER — Encounter (HOSPITAL_BASED_OUTPATIENT_CLINIC_OR_DEPARTMENT_OTHER): Payer: Self-pay | Admitting: *Deleted

## 2016-07-09 ENCOUNTER — Encounter (HOSPITAL_BASED_OUTPATIENT_CLINIC_OR_DEPARTMENT_OTHER): Payer: Self-pay | Admitting: *Deleted

## 2016-07-09 ENCOUNTER — Other Ambulatory Visit: Payer: Self-pay | Admitting: Obstetrics and Gynecology

## 2016-07-09 NOTE — Progress Notes (Signed)
NPO AFTER MN.  ARRIVE AT 0930.  NEEDS HG AND URINE PREG.  WILL TAKE ZANTAC AND CLARITIN AM DOS W/ SIPS OF WATER.

## 2016-07-11 ENCOUNTER — Encounter (HOSPITAL_BASED_OUTPATIENT_CLINIC_OR_DEPARTMENT_OTHER): Payer: Self-pay | Admitting: Obstetrics and Gynecology

## 2016-07-11 DIAGNOSIS — R102 Pelvic and perineal pain: Secondary | ICD-10-CM | POA: Diagnosis present

## 2016-07-11 DIAGNOSIS — N83202 Unspecified ovarian cyst, left side: Secondary | ICD-10-CM | POA: Diagnosis present

## 2016-07-11 NOTE — H&P (Signed)
Joyce Massey is an 26 y.o. female. Who presents for evaluation of pelvic pain and persistent left ovarian cyst.  The patient was seen on 05/07/16 in the ED for acute onset of pelvic pain, diagnosed with ruptured right ovarian cyst and left ovarian cyst.  She has had several exacerbations of pain  since then,after having initial improvement in the pain .  She had a repeat office ultrasound showing persistence of left ovarian complex cyst and opts for laparoscopy  Pertinent Gynecological History: Menses: regular every 28 days without intermenstrual spotting Bleeding: normal Contraception: OCP (estrogen/progesterone) DES exposure: unknown Blood transfusions: none Sexually transmitted diseases: no past history Previous GYN Procedures: none  Last mammogram: n/a  Last pap: normal Date: 12/20/15 OB History: G0, P0   Menstrual History: Menarche age: 56 Patient's last menstrual period was 07/09/2016 (approximate).    Past Medical History:  Diagnosis Date  . ADHD (attention deficit hyperactivity disorder)   . Anxiety   . GERD (gastroesophageal reflux disease)   . History of gastric ulcer   . Pelvic pain     Past Surgical History:  Procedure Laterality Date  . LAPAROSCOPIC CHOLECYSTECTOMY  2012  approx.    Family History  Problem Relation Age of Onset  . Hypertension Mother   . Hyperlipidemia Father   . Heart disease Maternal Grandmother     Social History:  reports that she quit smoking about 2 years ago. Her smoking use included Cigarettes. She has a 2.50 pack-year smoking history. She has never used smokeless tobacco. She reports that she drinks about 1.2 oz of alcohol per week . She reports that she does not use drugs.  Allergies:  Allergies  Allergen Reactions  . Gabapentin Other (See Comments)    "hallucinations"  . Mobic [Meloxicam] Nausea And Vomiting    HEADACHES  . Latex Rash  . Tape Rash    Some tapes cause rashes (adhesives may be the reason)    Prescriptions  Prior to Admission  Medication Sig Dispense Refill Last Dose  . amphetamine-dextroamphetamine (ADDERALL) 20 MG tablet Take 1 tablet by mouth 2 (two) times daily. Am and then 4-6 hours later  0 07/08/2016  . cyclobenzaprine (FLEXERIL) 10 MG tablet Take 10 mg by mouth 3 (three) times daily as needed for muscle spasms.   07/08/2016  . ibuprofen (ADVIL,MOTRIN) 200 MG tablet Take 200 mg by mouth every 6 (six) hours as needed.   07/08/2016  . loratadine (CLARITIN) 10 MG tablet Take 10 mg by mouth every morning.    07/12/2016 at 0830  . Multiple Vitamins-Calcium (ONE-A-DAY WOMENS FORMULA PO) Take 1 tablet by mouth daily.   Past Week at Unknown time  . norethindrone-ethinyl estradiol 1/35 (ORTHO-NOVUM, NORTREL,CYCLAFEM) tablet Take 1 tablet by mouth every evening.   07/07/2016  . ondansetron (ZOFRAN) 4 MG tablet Take 4 mg by mouth every 8 (eight) hours as needed for nausea or vomiting.   07/08/2016  . ranitidine (ZANTAC) 150 MG tablet Take 150 mg by mouth as needed for heartburn.   07/12/2016 at 0830  . Crisaborole (EUCRISA) 2 % OINT Apply topically 2 (two) times daily as needed.    More than a month at Unknown time  . Naproxen Sodium (ALEVE) 220 MG CAPS Take by mouth as needed.   More than a month at Unknown time    Review of Systems  Constitutional: Negative.   HENT: Negative.   Eyes: Negative.   Respiratory: Negative.   Cardiovascular: Negative.   Gastrointestinal: Positive for abdominal pain.  Genitourinary: Negative.   Musculoskeletal: Negative.   Skin: Negative.   Neurological: Negative.   Psychiatric/Behavioral: The patient is nervous/anxious.     Blood pressure 118/71, pulse 75, temperature 98.7 F (37.1 C), temperature source Oral, resp. rate 16, height 5\' 7"  (1.702 m), weight 146 lb (66.2 kg), last menstrual period 07/09/2016, SpO2 100 %. Physical Exam  Constitutional: She is oriented to person, place, and time. She appears well-developed and well-nourished.  HENT:  Head: Normocephalic  and atraumatic.  Eyes: EOM are normal.  Neck: Normal range of motion. Neck supple.  Cardiovascular: Normal rate and regular rhythm.   Respiratory: Effort normal and breath sounds normal.  GI: Soft. Bowel sounds are normal.  Genitourinary:  Genitourinary Comments: Pelvic exam: normal external genitalia, vulva, vagina, cervix, uterus and adnexa.  Musculoskeletal: Normal range of motion.  Neurological: She is alert and oriented to person, place, and time.  Skin: Skin is warm and dry.  Psychiatric: She has a normal mood and affect.    Results for orders placed or performed during the hospital encounter of 07/12/16 (from the past 24 hour(s))  Hemoglobin-hemacue, POC     Status: None   Collection Time: 07/12/16 10:39 AM  Result Value Ref Range   Hemoglobin 14.0 12.0 - 15.0 g/dL     Assessment/Plan: Pt with history of intermittent pelvic pain at least since 04/2016 and apparent rupture of right ovarian cyst at that time with current persisitent left ovarian cyst while using birth control pills.  Pt was given the option of presumptive treatment for possible endometriosis vs laparoscopic evaluation and the patient chose the latter.  The risks of anesthesia bleeding,infection and damage to adjacent organs reviewed with the patient and she wishes to proceed.  Vitali Seibert P 07/12/2016, 11:18 AM

## 2016-07-12 ENCOUNTER — Ambulatory Visit (HOSPITAL_BASED_OUTPATIENT_CLINIC_OR_DEPARTMENT_OTHER): Payer: Commercial Managed Care - HMO | Admitting: Anesthesiology

## 2016-07-12 ENCOUNTER — Encounter (HOSPITAL_BASED_OUTPATIENT_CLINIC_OR_DEPARTMENT_OTHER)
Admission: RE | Disposition: A | Discharge status: Home / Self Care | Payer: Self-pay | Source: Ambulatory Visit | Attending: Obstetrics and Gynecology

## 2016-07-12 ENCOUNTER — Ambulatory Visit (HOSPITAL_BASED_OUTPATIENT_CLINIC_OR_DEPARTMENT_OTHER)
Admission: RE | Admit: 2016-07-12 | Discharge: 2016-07-12 | Disposition: A | Discharge status: Home / Self Care | Payer: Commercial Managed Care - HMO | Source: Ambulatory Visit | Attending: Obstetrics and Gynecology | Admitting: Obstetrics and Gynecology

## 2016-07-12 ENCOUNTER — Encounter (HOSPITAL_BASED_OUTPATIENT_CLINIC_OR_DEPARTMENT_OTHER): Payer: Self-pay

## 2016-07-12 DIAGNOSIS — N803 Endometriosis of pelvic peritoneum: Secondary | ICD-10-CM | POA: Diagnosis not present

## 2016-07-12 DIAGNOSIS — K219 Gastro-esophageal reflux disease without esophagitis: Secondary | ICD-10-CM | POA: Insufficient documentation

## 2016-07-12 DIAGNOSIS — Z91040 Latex allergy status: Secondary | ICD-10-CM | POA: Insufficient documentation

## 2016-07-12 DIAGNOSIS — Z87891 Personal history of nicotine dependence: Secondary | ICD-10-CM | POA: Diagnosis not present

## 2016-07-12 DIAGNOSIS — Z8719 Personal history of other diseases of the digestive system: Secondary | ICD-10-CM | POA: Diagnosis not present

## 2016-07-12 DIAGNOSIS — Z79899 Other long term (current) drug therapy: Secondary | ICD-10-CM | POA: Insufficient documentation

## 2016-07-12 DIAGNOSIS — R102 Pelvic and perineal pain: Secondary | ICD-10-CM | POA: Diagnosis not present

## 2016-07-12 DIAGNOSIS — F909 Attention-deficit hyperactivity disorder, unspecified type: Secondary | ICD-10-CM | POA: Insufficient documentation

## 2016-07-12 DIAGNOSIS — F419 Anxiety disorder, unspecified: Secondary | ICD-10-CM | POA: Insufficient documentation

## 2016-07-12 DIAGNOSIS — N83202 Unspecified ovarian cyst, left side: Secondary | ICD-10-CM | POA: Insufficient documentation

## 2016-07-12 DIAGNOSIS — N809 Endometriosis, unspecified: Secondary | ICD-10-CM | POA: Insufficient documentation

## 2016-07-12 DIAGNOSIS — Z791 Long term (current) use of non-steroidal anti-inflammatories (NSAID): Secondary | ICD-10-CM | POA: Insufficient documentation

## 2016-07-12 HISTORY — DX: Pelvic and perineal pain: R10.2

## 2016-07-12 HISTORY — DX: Gastro-esophageal reflux disease without esophagitis: K21.9

## 2016-07-12 HISTORY — PX: LAPAROSCOPIC OVARIAN CYSTECTOMY: SHX6248

## 2016-07-12 LAB — POCT PREGNANCY, URINE: Preg Test, Ur: NEGATIVE

## 2016-07-12 LAB — POCT HEMOGLOBIN-HEMACUE: HEMOGLOBIN: 14 g/dL (ref 12.0–15.0)

## 2016-07-12 SURGERY — EXCISION, CYST, OVARY, LAPAROSCOPIC
Anesthesia: General | Site: Abdomen | Laterality: Left

## 2016-07-12 MED ORDER — FENTANYL CITRATE (PF) 100 MCG/2ML IJ SOLN
INTRAMUSCULAR | Status: DC | PRN
  Administered 2016-07-12 (×4): 50 ug via INTRAVENOUS

## 2016-07-12 MED ORDER — DEXAMETHASONE SODIUM PHOSPHATE 4 MG/ML IJ SOLN
INTRAMUSCULAR | Status: DC | PRN
  Administered 2016-07-12: 10 mg via INTRAVENOUS

## 2016-07-12 MED ORDER — MEPERIDINE HCL 25 MG/ML IJ SOLN
6.2500 mg | INTRAMUSCULAR | Status: DC | PRN
  Filled 2016-07-12: qty 1

## 2016-07-12 MED ORDER — METOCLOPRAMIDE HCL 5 MG/ML IJ SOLN
INTRAMUSCULAR | Status: AC
  Filled 2016-07-12: qty 2

## 2016-07-12 MED ORDER — ROCURONIUM BROMIDE 50 MG/5ML IV SOSY
PREFILLED_SYRINGE | INTRAVENOUS | Status: AC
  Filled 2016-07-12: qty 5

## 2016-07-12 MED ORDER — ACETAMINOPHEN 10 MG/ML IV SOLN
1000.0000 mg | Freq: Four times a day (QID) | INTRAVENOUS | Status: DC
  Administered 2016-07-12: 1000 mg via INTRAVENOUS
  Filled 2016-07-12: qty 100

## 2016-07-12 MED ORDER — LIDOCAINE 2% (20 MG/ML) 5 ML SYRINGE
INTRAMUSCULAR | Status: AC
  Filled 2016-07-12: qty 5

## 2016-07-12 MED ORDER — SUGAMMADEX SODIUM 200 MG/2ML IV SOLN
INTRAVENOUS | Status: AC
  Filled 2016-07-12: qty 2

## 2016-07-12 MED ORDER — HEPARIN SODIUM (PORCINE) 5000 UNIT/ML IJ SOLN
INTRAMUSCULAR | Status: AC
  Filled 2016-07-12: qty 1

## 2016-07-12 MED ORDER — LIDOCAINE 2% (20 MG/ML) 5 ML SYRINGE
INTRAMUSCULAR | Status: DC | PRN
  Administered 2016-07-12: 100 mg via INTRAVENOUS

## 2016-07-12 MED ORDER — KETOROLAC TROMETHAMINE 30 MG/ML IJ SOLN
INTRAMUSCULAR | Status: DC | PRN
  Administered 2016-07-12: 30 mg via INTRAVENOUS
  Administered 2016-07-12: 30 mg via INTRAMUSCULAR

## 2016-07-12 MED ORDER — BUPIVACAINE HCL (PF) 0.25 % IJ SOLN
INTRAMUSCULAR | Status: DC | PRN
  Administered 2016-07-12: 30 mL

## 2016-07-12 MED ORDER — DEXAMETHASONE SODIUM PHOSPHATE 10 MG/ML IJ SOLN
INTRAMUSCULAR | Status: AC
  Filled 2016-07-12: qty 1

## 2016-07-12 MED ORDER — METOCLOPRAMIDE HCL 5 MG/ML IJ SOLN
INTRAMUSCULAR | Status: DC | PRN
  Administered 2016-07-12: 10 mg via INTRAVENOUS

## 2016-07-12 MED ORDER — KETOROLAC TROMETHAMINE 30 MG/ML IJ SOLN
INTRAMUSCULAR | Status: AC
  Filled 2016-07-12: qty 2

## 2016-07-12 MED ORDER — HYDROMORPHONE HCL 2 MG/ML IJ SOLN
INTRAMUSCULAR | Status: AC
  Filled 2016-07-12: qty 1

## 2016-07-12 MED ORDER — HYDROCODONE-ACETAMINOPHEN 5-325 MG PO TABS: 1.0000 | ORAL_TABLET | ORAL | 0 refills | Status: DC | PRN

## 2016-07-12 MED ORDER — PROPOFOL 10 MG/ML IV BOLUS
INTRAVENOUS | Status: AC
  Filled 2016-07-12: qty 20

## 2016-07-12 MED ORDER — SUGAMMADEX SODIUM 200 MG/2ML IV SOLN
INTRAVENOUS | Status: DC | PRN
  Administered 2016-07-12: 200 mg via INTRAVENOUS

## 2016-07-12 MED ORDER — FENTANYL CITRATE (PF) 100 MCG/2ML IJ SOLN
INTRAMUSCULAR | Status: AC
  Filled 2016-07-12: qty 4

## 2016-07-12 MED ORDER — MIDAZOLAM HCL 5 MG/5ML IJ SOLN
INTRAMUSCULAR | Status: DC | PRN
  Administered 2016-07-12: 2 mg via INTRAVENOUS

## 2016-07-12 MED ORDER — ONDANSETRON HCL 4 MG/2ML IJ SOLN
INTRAMUSCULAR | Status: DC | PRN
  Administered 2016-07-12: 4 mg via INTRAVENOUS

## 2016-07-12 MED ORDER — HEPARIN SODIUM (PORCINE) 5000 UNIT/ML IJ SOLN
5000.0000 [IU] | Freq: Once | INTRAMUSCULAR | Status: DC
  Filled 2016-07-12 (×2): qty 1

## 2016-07-12 MED ORDER — IBUPROFEN 600 MG PO TABS: ORAL_TABLET | ORAL | 0 refills | Status: DC

## 2016-07-12 MED ORDER — LACTATED RINGERS IV SOLN
INTRAVENOUS | Status: DC
  Administered 2016-07-12 (×3): via INTRAVENOUS
  Filled 2016-07-12: qty 1000

## 2016-07-12 MED ORDER — PROPOFOL 10 MG/ML IV BOLUS
INTRAVENOUS | Status: DC | PRN
  Administered 2016-07-12: 130 mg via INTRAVENOUS

## 2016-07-12 MED ORDER — ACETAMINOPHEN 10 MG/ML IV SOLN
INTRAVENOUS | Status: AC
  Filled 2016-07-12: qty 100

## 2016-07-12 MED ORDER — ROCURONIUM BROMIDE 50 MG/5ML IV SOSY
PREFILLED_SYRINGE | INTRAVENOUS | Status: DC | PRN
  Administered 2016-07-12: 50 mg via INTRAVENOUS
  Administered 2016-07-12: 20 mg via INTRAVENOUS

## 2016-07-12 MED ORDER — HYDROMORPHONE HCL 1 MG/ML IJ SOLN
0.2500 mg | INTRAMUSCULAR | Status: DC | PRN
  Administered 2016-07-12 (×3): 0.5 mg via INTRAVENOUS
  Filled 2016-07-12: qty 0.5

## 2016-07-12 MED ORDER — ONDANSETRON HCL 4 MG/2ML IJ SOLN
4.0000 mg | Freq: Once | INTRAMUSCULAR | Status: DC | PRN
  Filled 2016-07-12: qty 2

## 2016-07-12 MED ORDER — MIDAZOLAM HCL 2 MG/2ML IJ SOLN
INTRAMUSCULAR | Status: AC
  Filled 2016-07-12: qty 2

## 2016-07-12 MED ORDER — EPHEDRINE SULFATE-NACL 50-0.9 MG/10ML-% IV SOSY
PREFILLED_SYRINGE | INTRAVENOUS | Status: DC | PRN
  Administered 2016-07-12 (×2): 10 mg via INTRAVENOUS

## 2016-07-12 MED ORDER — ONDANSETRON HCL 4 MG/2ML IJ SOLN
INTRAMUSCULAR | Status: AC
  Filled 2016-07-12: qty 2

## 2016-07-12 MED FILL — HYDROCODON-APAP 5-325: 5-325 | 3 days supply | Qty: 20 | Fill #0

## 2016-07-12 SURGICAL SUPPLY — 48 items
CABLE HIGH FREQUENCY MONO STRZ (ELECTRODE) ×2 IMPLANT
CLOTH BEACON ORANGE TIMEOUT ST (SAFETY) ×2 IMPLANT
CONTAINER PREFILL 10% NBF 60ML (FORM) IMPLANT
COVER MAYO STAND STRL (DRAPES) ×2 IMPLANT
DERMABOND ADVANCED (GAUZE/BANDAGES/DRESSINGS) ×1
DERMABOND ADVANCED .7 DNX12 (GAUZE/BANDAGES/DRESSINGS) ×1 IMPLANT
DRAPE LAPAROTOMY T 102X78X121 (DRAPES) ×2 IMPLANT
DRAPE SHEET LG 3/4 BI-LAMINATE (DRAPES) ×2 IMPLANT
DRSG OPSITE POSTOP 3X4 (GAUZE/BANDAGES/DRESSINGS) IMPLANT
DRSG TELFA 3X8 NADH (GAUZE/BANDAGES/DRESSINGS) ×2 IMPLANT
DURAPREP 26ML APPLICATOR (WOUND CARE) ×2 IMPLANT
GLOVE BIOGEL PI IND STRL 6.5 (GLOVE) ×1 IMPLANT
GLOVE BIOGEL PI IND STRL 7.0 (GLOVE) ×3 IMPLANT
GLOVE BIOGEL PI IND STRL 7.5 (GLOVE) ×1 IMPLANT
GLOVE BIOGEL PI INDICATOR 6.5 (GLOVE) ×1
GLOVE BIOGEL PI INDICATOR 7.0 (GLOVE) ×3
GLOVE BIOGEL PI INDICATOR 7.5 (GLOVE) ×1
GLOVE SURG SS PI 6.5 STRL IVOR (GLOVE) ×6 IMPLANT
GOWN STRL REUS W/TWL LRG LVL3 (GOWN DISPOSABLE) ×6 IMPLANT
LEGGING LITHOTOMY PAIR STRL (DRAPES) ×2 IMPLANT
NS IRRIG 1000ML POUR BTL (IV SOLUTION) ×2 IMPLANT
PACK LAPAROSCOPY BASIN (CUSTOM PROCEDURE TRAY) IMPLANT
PACK TRENDGUARD 450 HYBRID PRO (MISCELLANEOUS) IMPLANT
PACK TRENDGUARD 600 HYBRD PROC (MISCELLANEOUS) IMPLANT
PAD POSITIONING PINK XL (MISCELLANEOUS) ×2 IMPLANT
POUCH SPECIMEN RETRIEVAL 10MM (ENDOMECHANICALS) IMPLANT
PROTECTOR NERVE ULNAR (MISCELLANEOUS) ×4 IMPLANT
SET IRRIG TUBING LAPAROSCOPIC (IRRIGATION / IRRIGATOR) IMPLANT
SHEARS HARMONIC ACE PLUS 36CM (ENDOMECHANICALS) IMPLANT
STRIP CLOSURE SKIN 1/4X3 (GAUZE/BANDAGES/DRESSINGS) IMPLANT
SUT MNCRL AB 3-0 PS2 18 (SUTURE) ×2 IMPLANT
SUT MNCRL AB 4-0 PS2 18 (SUTURE) IMPLANT
SUT VIC AB 3-0 PS2 18 (SUTURE)
SUT VIC AB 3-0 PS2 18XBRD (SUTURE) IMPLANT
SUT VICRYL 0 ENDOLOOP (SUTURE) IMPLANT
SUT VICRYL 0 UR6 27IN ABS (SUTURE) ×4 IMPLANT
SYR 30ML LL (SYRINGE) ×2 IMPLANT
TOWEL OR 17X24 6PK STRL BLUE (TOWEL DISPOSABLE) ×4 IMPLANT
TRAY FOLEY CATH SILVER 14FR (SET/KITS/TRAYS/PACK) ×2 IMPLANT
TRENDGUARD 450 HYBRID PRO PACK (MISCELLANEOUS)
TRENDGUARD 600 HYBRID PROC PK (MISCELLANEOUS)
TROCAR BALL TOP DISP 5MM (ENDOMECHANICALS) IMPLANT
TROCAR OPTI TIP 5M 100M (ENDOMECHANICALS) ×2 IMPLANT
TROCAR XCEL DIL TIP R 11M (ENDOMECHANICALS) IMPLANT
TROCAR XCEL OPT SLVE 5M 100M (ENDOMECHANICALS) ×2 IMPLANT
TUBING INSUF HEATED (TUBING) ×2 IMPLANT
WARMER LAPAROSCOPE (MISCELLANEOUS) ×2 IMPLANT
WATER STERILE IRR 1000ML POUR (IV SOLUTION) IMPLANT

## 2016-07-12 NOTE — Discharge Instructions (Signed)
°  Post Anesthesia Home Care Instructions ° °Activity: °Get plenty of rest for the remainder of the day. A responsible adult should stay with you for 24 hours following the procedure.  °For the next 24 hours, DO NOT: °-Drive a car °-Operate machinery °-Drink alcoholic beverages °-Take any medication unless instructed by your physician °-Make any legal decisions or sign important papers. ° °Meals: °Start with liquid foods such as gelatin or soup. Progress to regular foods as tolerated. Avoid greasy, spicy, heavy foods. If nausea and/or vomiting occur, drink only clear liquids until the nausea and/or vomiting subsides. Call your physician if vomiting continues. ° °Special Instructions/Symptoms: °Your throat may feel dry or sore from the anesthesia or the breathing tube placed in your throat during surgery. If this causes discomfort, gargle with warm salt water. The discomfort should disappear within 24 hours. ° °If you had a scopolamine patch placed behind your ear for the management of post- operative nausea and/or vomiting: ° °1. The medication in the patch is effective for 72 hours, after which it should be removed.  Wrap patch in a tissue and discard in the trash. Wash hands thoroughly with soap and water. °2. You may remove the patch earlier than 72 hours if you experience unpleasant side effects which may include dry mouth, dizziness or visual disturbances. °3. Avoid touching the patch. Wash your hands with soap and water after contact with the patch. °  °HOME CARE INSTRUCTIONS - LAPAROSCOPY ° °Wound Care: The bandaids or dressing which are placed over the skin openings may be removed the day after surgery. The incision should be kept clean and dry. The stitches do not need to be removed. Should the incision become sore, red, and swollen after the first week, check with your doctor. ° °Personal Hygiene: Shower the day after your procedure. Always wipe from front to back after elimination.  ° °Activity: Do not  drive or operate any equipment today. The effects of the anesthesia are still present and drowsiness may result. Rest today, not necessarily flat bed rest, just take it easy. You may resume your normal activity in one to three days or as instructed by your physician. ° °Sexual Activity: You resume sexual activity as indicated by your physician_________. °If your laparoscopy was for a sterilization ( tubes tied ), continue current method of birth control until after your next period or ask for specific instructions from your doctor. ° °Diet: Eat a light diet as desired this evening. You may resume a regular diet tomorrow. ° °Return to Work: Two to three days or as indicated by your doctor. ° °Expectations °After Surgery: Your surgery will cause vaginal drainage or spotting which may continue for 2-3 days. Mild abdominal discomfort or tenderness is not unusual and some shoulder pain may also be noted which can be relieved by lying flat in pain. ° °Call Your Doctor °If these Occur:  Persistent or heavy bleeding at incision site °      Redness or swelling around incision °      Elevation of temperature greater than 100 degrees F ° °Call for follow-up appointment _____________. °

## 2016-07-12 NOTE — Transfer of Care (Signed)
  Last Vitals:  Vitals:   07/12/16 0939  BP: 118/71  Pulse: 75  Resp: 16  Temp: 37.1 C    Last Pain:  Vitals:   07/12/16 0939  TempSrc: Oral      Patients Stated Pain Goal: 5 (07/12/16 1023)  Immediate Anesthesia Transfer of Care Note  Patient: Joyce Massey  Procedure(s) Performed: Procedure(s) (LRB): OPERATIVE LAPAROSCOPIC OVARIAN CYSTECTOMY AND PERITONEAL BIOPSY (Left)  Patient Location: PACU  Anesthesia Type: General  Level of Consciousness: awake, alert  and oriented  Airway & Oxygen Therapy: Patient Spontanous Breathing and Patient connected to nasal cannula oxygen  Post-op Assessment: Report given to PACU RN and Post -op Vital signs reviewed and stable  Post vital signs: Reviewed and stable  Complications: No apparent anesthesia complications

## 2016-07-12 NOTE — Anesthesia Procedure Notes (Signed)
Procedure Name: Intubation Date/Time: 07/12/2016 11:38 AM Performed by: Lillia Abed Pre-anesthesia Checklist: Patient identified, Emergency Drugs available, Suction available and Patient being monitored Patient Re-evaluated:Patient Re-evaluated prior to inductionOxygen Delivery Method: Circle system utilized Preoxygenation: Pre-oxygenation with 100% oxygen Intubation Type: IV induction Ventilation: Mask ventilation without difficulty and Oral airway inserted - appropriate to patient size Laryngoscope Size: Mac and 3 Grade View: Grade I Tube type: Oral Tube size: 7.0 mm Number of attempts: 1 Airway Equipment and Method: Stylet,  Oral airway and LTA kit utilized Placement Confirmation: ETT inserted through vocal cords under direct vision,  positive ETCO2 and breath sounds checked- equal and bilateral Secured at: 21 (at lip) cm Tube secured with: Tape Dental Injury: Teeth and Oropharynx as per pre-operative assessment

## 2016-07-12 NOTE — Anesthesia Postprocedure Evaluation (Signed)
Anesthesia Post Note  Patient: Joyce Massey  Procedure(s) Performed: Procedure(s) (LRB): OPERATIVE LAPAROSCOPIC OVARIAN CYSTECTOMY AND PERITONEAL BIOPSY (Left)  Patient location during evaluation: PACU Anesthesia Type: General Level of consciousness: awake and alert, oriented and patient cooperative Pain management: pain level controlled Vital Signs Assessment: post-procedure vital signs reviewed and stable Respiratory status: spontaneous breathing, nonlabored ventilation and respiratory function stable Cardiovascular status: blood pressure returned to baseline and stable Postop Assessment: no signs of nausea or vomiting Anesthetic complications: no       Last Vitals:  Vitals:   07/12/16 1415 07/12/16 1430  BP:    Pulse:    Resp: 14 12  Temp:      Last Pain:  Vitals:   07/12/16 1430  TempSrc:   PainSc: 4                  Colene Mines,E. Advaith Lamarque

## 2016-07-12 NOTE — Anesthesia Preprocedure Evaluation (Signed)
Anesthesia Evaluation  Patient identified by MRN, date of birth, ID band Patient awake    Reviewed: Allergy & Precautions, NPO status , Patient's Chart, lab work & pertinent test results  Airway Mallampati: I  TM Distance: >3 FB Neck ROM: Full    Dental   Pulmonary former smoker,    Pulmonary exam normal        Cardiovascular Normal cardiovascular exam     Neuro/Psych    GI/Hepatic GERD  Medicated and Controlled,  Endo/Other    Renal/GU      Musculoskeletal   Abdominal   Peds  Hematology   Anesthesia Other Findings   Reproductive/Obstetrics                             Anesthesia Physical Anesthesia Plan  ASA: II  Anesthesia Plan: General   Post-op Pain Management:    Induction: Intravenous  Airway Management Planned: Oral ETT  Additional Equipment:   Intra-op Plan:   Post-operative Plan: Extubation in OR  Informed Consent: I have reviewed the patients History and Physical, chart, labs and discussed the procedure including the risks, benefits and alternatives for the proposed anesthesia with the patient or authorized representative who has indicated his/her understanding and acceptance.     Plan Discussed with: CRNA and Surgeon  Anesthesia Plan Comments:         Anesthesia Quick Evaluation

## 2016-07-12 NOTE — Brief Op Note (Signed)
07/12/2016  1:24 PM  PATIENT:  Joyce Massey  25 y.o. female  PRE-OPERATIVE DIAGNOSIS:  Pelvic Pain  POST-OPERATIVE DIAGNOSIS:  endometriosis  PROCEDURE:  Procedure(s): OPERATIVE LAPAROSCOPIC OVARIAN CYSTECTOMY AND PERITONEAL BIOPSY (Left) uterosacral  SURGEON:  Surgeon(s) and Role:    * Eldred Manges, MD - Primary  PHYSICIAN ASSISTANT:   ASSISTANTS: Earnstine Regal certified physician assistant  ANESTHESIA:   local and general  EBL:  Total I/O In: 1700 [I.V.:1700] Out: 220 [Urine:200; Blood:20]  BLOOD ADMINISTERED:none  DRAINS: none   LOCAL MEDICATIONS USED:  MARCAINE    and Amount: 30 ml  SPECIMEN:  Source of Specimen:  Left ovarian cyst wall.  Left uterosacral ligament biopsy  DISPOSITION OF SPECIMEN:  PATHOLOGY  COUNTS:  YES  DICTATION: .Note written in EPIC  PLAN OF CARE: Discharge to home after PACU  PATIENT DISPOSITION:  PACU - hemodynamically stable.   Delay start of Pharmacological VTE agent (>24hrs) due to surgical blood loss or risk of bleeding: Yes. SCD hose during the procedure

## 2016-07-13 ENCOUNTER — Encounter (HOSPITAL_BASED_OUTPATIENT_CLINIC_OR_DEPARTMENT_OTHER): Payer: Self-pay | Admitting: Obstetrics and Gynecology

## 2016-07-14 ENCOUNTER — Encounter (HOSPITAL_COMMUNITY): Payer: Self-pay | Admitting: Emergency Medicine

## 2016-07-14 ENCOUNTER — Ambulatory Visit (HOSPITAL_COMMUNITY)
Admission: EM | Admit: 2016-07-14 | Discharge: 2016-07-14 | Disposition: A | Discharge status: Home / Self Care | Payer: Commercial Managed Care - HMO | Attending: Family Medicine | Admitting: Family Medicine

## 2016-07-14 DIAGNOSIS — B9789 Other viral agents as the cause of diseases classified elsewhere: Secondary | ICD-10-CM

## 2016-07-14 DIAGNOSIS — J069 Acute upper respiratory infection, unspecified: Secondary | ICD-10-CM

## 2016-07-14 DIAGNOSIS — K5903 Drug induced constipation: Secondary | ICD-10-CM

## 2016-07-14 MED ORDER — BENZONATATE 100 MG PO CAPS: 100.0000 mg | ORAL_CAPSULE | Freq: Three times a day (TID) | ORAL | 0 refills | Status: DC

## 2016-07-14 MED ORDER — SENNA-DOCUSATE SODIUM 8.6-50 MG PO TABS: 2.0000 | ORAL_TABLET | Freq: Every day | ORAL | 0 refills | Status: DC

## 2016-07-14 MED ORDER — ONDANSETRON 4 MG PO TBDP: 4.0000 mg | ORAL_TABLET | Freq: Three times a day (TID) | ORAL | 0 refills | Status: DC | PRN

## 2016-07-14 NOTE — ED Provider Notes (Signed)
CSN: [id number]     Arrival date & time 07/14/16  1201 History   None    Chief Complaint  Patient presents with  . URI   (Consider location/radiation/quality/duration/timing/severity/associated sxs/prior Treatment) 26 year old female presents to clinic with a chief complaint of cough, sore throat, chills, and constipation. She reports she had laparoscopic surgery on 07/12/2016 for endometriosis, and a left ovarian cyst. No complications reported through surgery, however she was unable to pass stool. She reports she took some of her mother's stool softener last night and was able to have a bowel movement this morning. Her husband is positive for influenza A, and is currently being treated. She reports having light spotting but otherwise no further complications.   The history is provided by the patient.  URI    Past Medical History:  Diagnosis Date  . ADHD (attention deficit hyperactivity disorder)   . Anxiety   . GERD (gastroesophageal reflux disease)   . History of gastric ulcer   . Pelvic pain    Past Surgical History:  Procedure Laterality Date  . LAPAROSCOPIC CHOLECYSTECTOMY  2012  approx.  Marland Kitchen LAPAROSCOPIC OVARIAN CYSTECTOMY Left 07/12/2016   Procedure: OPERATIVE LAPAROSCOPIC OVARIAN CYSTECTOMY AND PERITONEAL BIOPSY;  Surgeon: Eldred Manges, MD;  Location: [facility];  Service: Gynecology;  Laterality: Left;   Family History  Problem Relation Age of Onset  . Hypertension Mother   . Hyperlipidemia Father   . Heart disease Maternal Grandmother    Social History  Substance Use Topics  . Smoking status: Former Smoker    Packs/day: 0.50    Years: 5.00    Types: Cigarettes    Quit date: 07/09/2014  . Smokeless tobacco: Never Used  . Alcohol use 1.2 oz/week    2 Standard drinks or equivalent per week     Comment: occasional   OB History    Gravida Para Term Preterm AB Living   0 0 0 0 0 0   SAB TAB Ectopic Multiple Live Births   0 0 0 0 0     Review  of Systems  Reason unable to perform ROS: As covered in history of present illness.  All other systems reviewed and are negative.   Allergies  Gabapentin; Mobic [meloxicam]; Latex; and Tape  Home Medications   Prior to Admission medications   Medication Sig Start Date End Date Taking? Authorizing Provider  amphetamine-dextroamphetamine (ADDERALL) 20 MG tablet Take 1 tablet by mouth 2 (two) times daily. Am and then 4-6 hours later 04/14/16  Yes Historical Provider, MD  ibuprofen (ADVIL,MOTRIN) 600 MG tablet Ibuprofen 600 mg orally every 6 hours for 3 days, then every 6 hours as needed for pain 07/12/16  Yes Eldred Manges, MD  loratadine (CLARITIN) 10 MG tablet Take 10 mg by mouth every morning.    Yes Historical Provider, MD  Multiple Vitamins-Calcium (ONE-A-DAY WOMENS FORMULA PO) Take 1 tablet by mouth daily.   Yes Historical Provider, MD  benzonatate (TESSALON) 100 MG capsule Take 1 capsule (100 mg total) by mouth every 8 (eight) hours. 07/14/16   Barnet Glasgow, NP  Crisaborole (EUCRISA) 2 % OINT Apply topically 2 (two) times daily as needed.     Historical Provider, MD  cyclobenzaprine (FLEXERIL) 10 MG tablet Take 10 mg by mouth 3 (three) times daily as needed for muscle spasms.    Historical Provider, MD  HYDROcodone-acetaminophen (NORCO/VICODIN) 5-325 MG tablet Take 1 tablet by mouth every 4 (four) hours as needed for moderate pain. 07/12/16  Eldred Manges, MD  norethindrone-ethinyl estradiol 1/35 (ORTHO-NOVUM, NORTREL,CYCLAFEM) tablet Take 1 tablet by mouth every evening.    Historical Provider, MD  ondansetron (ZOFRAN ODT) 4 MG disintegrating tablet Take 1 tablet (4 mg total) by mouth every 8 (eight) hours as needed for nausea or vomiting. 07/14/16   Barnet Glasgow, NP  ranitidine (ZANTAC) 150 MG tablet Take 150 mg by mouth as needed for heartburn.    Historical Provider, MD  sennosides-docusate sodium (SENOKOT-S) 8.6-50 MG tablet Take 2 tablets by mouth daily. 07/14/16    Barnet Glasgow, NP   Meds Ordered and Administered this Visit  Medications - No data to display  BP 142/99 (BP Location: Right Arm)   Pulse 75   Temp 98.8 F (37.1 C) (Oral)   Resp 18   LMP 07/09/2016 (Approximate)   SpO2 100%  No data found.   Physical Exam  Constitutional: She is oriented to person, place, and time. She appears well-developed and well-nourished. No distress.  HENT:  Head: Normocephalic and atraumatic.  Right Ear: External ear normal.  Left Ear: External ear normal.  Nose: Nose normal.  Mouth/Throat: Oropharynx is clear and moist. No oropharyngeal exudate.  Cardiovascular: Normal rate and regular rhythm.   Pulmonary/Chest: Effort normal and breath sounds normal.  Abdominal: Soft. Bowel sounds are decreased. There is no hepatosplenomegaly. There is no tenderness. There is no rigidity, no rebound, no guarding and no CVA tenderness.  Less than 1 inch surgical scar at the umbilicus, edges closely approximated, no exterior signs of infection.  Neurological: She is alert and oriented to person, place, and time.  Skin: Skin is warm and dry. Capillary refill takes less than 2 seconds. She is not diaphoretic. No pallor.  Psychiatric: She has a normal mood and affect.  Nursing note and vitals reviewed.   Urgent Care Course     Procedures (including critical care time)  Labs Review Labs Reviewed - No data to display  Imaging Review No results found.    MDM   1. Viral URI with cough   2. Drug-induced constipation    You most likely have a viral URI, I advise rest, plenty of fluids and management of symptoms with over the counter medicines. For symptoms you may take Tylenol as needed every 4-6 hours for body aches or fever, not to exceed 4,000 mg a day, Take mucinex or mucinex DM ever 12 hours with a full glass of water, you may use an inhaled steroid such as Flonase, 2 sprays each nostril once a day for congestion, or an antihistamine such as Claritin or  Zyrtec once a day. For cough, I have prescribed a medication called Tessalon. Take 1 tablet every 8 hours as needed for your cough.   To side effects from your anesthesia, and from your pain medication. I have prescribed a stool softener, take 1 tablet daily as needed.For Nausea, I have prescribed Zofran, take 1 tablet under the tongue every 8 hours as needed.   Should he becomes febrile, have worsened abdominal pain, tachycardia, or low blood pressure, or have worsened vaginal bleeding, I recommend following up at [facility].  Should your symptoms worsen or fail to resolve, follow up with your primary care provider or return to clinic.       Barnet Glasgow, NP 07/14/16 1328

## 2016-07-14 NOTE — Discharge Instructions (Signed)
You most likely have a viral URI, I advise rest, plenty of fluids and management of symptoms with over the counter medicines. For symptoms you may take Tylenol as needed every 4-6 hours for body aches or fever, not to exceed 4,000 mg a day, Take mucinex or mucinex DM ever 12 hours with a full glass of water, you may use an inhaled steroid such as Flonase, 2 sprays each nostril once a day for congestion, or an antihistamine such as Claritin or Zyrtec once a day. For cough, I have prescribed a medication called Tessalon. Take 1 tablet every 8 hours as needed for your cough.   To side effects from your anesthesia, and from your pain medication. I have prescribed a stool softener, take 1 tablet daily as needed.For Nausea, I have prescribed Zofran, take 1 tablet under the tongue every 8 hours as needed.   Should he becomes febrile, have worsened abdominal pain, tachycardia, or low blood pressure, or have worsened vaginal bleeding, I recommend following up at [facility].  Should your symptoms worsen or fail to resolve, follow up with your primary care provider or return to clinic.

## 2016-07-14 NOTE — ED Triage Notes (Signed)
On Thursday, patient had laparoscopic surgery for endometriosis and cyst.    Yesterday started with a minimal sore throat, productive cough-yellow mucus.  Reports chest feels heavy.  Today patient feels bad in general.  Patient did have a bowel movement this am, after taking stool softner last night-hard and then followed by diarrhea.    Family member has been diagnosed with influenza A/uri

## 2016-07-31 NOTE — Op Note (Signed)
Operative Laparoscopy Procedure Note  Indications: The patient is a 26 y.o. female with pelvic pain and a persisitent ovarian cyst  Pre-operative Diagnosis: Pelvic pain. left Ovarian cyst  Post-operative Diagnosis:Same.  Endometriosis  Surgeon: Eldred Manges   Assistants: Earnstine Regal PA-C  Anesthesia: General endotracheal anesthesia  ASA Class: 2  Procedure Details  The patient was seen in the Holding Room. The risks, benefits, complications, treatment options, and expected outcomes were discussed with the patient. The possibilities of reaction to medication, pulmonary aspiration, perforation of viscus, bleeding, recurrent infection, the need for additional procedures, failure to diagnose a condition, and creating a complication requiring transfusion or operation were discussed with the patient. The patient concurred with the proposed plan, giving informed consent. The patient was taken to the Operating Room, identified as Joyce Massey and the procedure verified as Diagnostic Laparoscopy. A Time Out was held and the above information confirmed.  After induction of general anesthesia, the patient was placed in modified dorsal lithotomy position where she was prepped, draped, and catheterized with a foley catheter in the normal, sterile fashion.  The cervix was visualized and an intrauterine manipulator was placed. A 1 cm umbilical incision was then performed after subumbilical and suprapubic instillation of 0.25% Marcaine for a total of 10cc.. A subumbilical incision was made and area down to the fascia where an incision was made in the fascia marked. The peritoneum was entered bluntly and a Hassan cannula placed into the peritoneal cavity under direct visualization and then cured with the marking sutures. Upper skull. The instrument was then placed through that trocar sleeve and a pneumoperitoneum established. A suprapubic incision was made to the left and right of midline and laparoscopic  probe trocar placed through that incision into the peritoneal cavity under direct visualization. The above findings were noted. The aforementioned left ovarian cyst was noted. The Cortex was incised and the cyst wall dissected with a combination of blunt and sharp dissection. Cyst wall was removed from the peritoneal cavity through the right suprapubic trocar. An area of endometriosis on the left. utero sacral ligament was then hydrodissected and excised, and removed from the peritoneal cavity. Constantly 60 cc of warm lactated Ringer's was left in the peritoneal cavity. Hemostasis was noted to be adequate.  Following the procedure the umbilical sheath was removed after intra-abdominal carbon dioxide was expressed. The incision was closed with subcutaneous and subcuticular sutures of 4-0 Vicryl. The intrauterine manipulator was then removed.  Instrument, sponge, and needle counts were correct prior to abdominal closure and at the conclusion of the case.   Findings: The anterior cul-de-sac and round ligaments no adhesions or evidence of endometriosis. The uterus . Normal. The adnexa . The left ovary contained a 2 cm hemorrhagic cyst. It could also be consistent with an endometrioma. This was excised. The right ovary was within normal limits as were both the fallopian tubes. Cul-de-sac , posterior cul-de-sac was free of adhesions. There were powder burn-like lesions along the left uterosacral ligament consistent with endometriosis. Was a similar stellate incision on the left pelvic sidewall peritoneum overlying the left ureter.  Estimated Blood Loss:  less than 50 mL         Drains: None                 Specimens: Left ovarian cyst wall and peritoneal biopsies              Complications:  None; patient tolerated the procedure well.  Disposition: PACU - hemodynamically stable.         Condition: stable  Pharmacologic DVT prophylaxis was omitted. Compression hose were used throughout the  case.

## 2016-09-05 DIAGNOSIS — L2089 Other atopic dermatitis: Secondary | ICD-10-CM | POA: Diagnosis not present

## 2016-09-05 DIAGNOSIS — L700 Acne vulgaris: Secondary | ICD-10-CM | POA: Diagnosis not present

## 2016-09-05 DIAGNOSIS — L814 Other melanin hyperpigmentation: Secondary | ICD-10-CM | POA: Diagnosis not present

## 2016-09-25 DIAGNOSIS — Z13220 Encounter for screening for lipoid disorders: Secondary | ICD-10-CM | POA: Diagnosis not present

## 2016-09-25 DIAGNOSIS — Z0000 Encounter for general adult medical examination without abnormal findings: Secondary | ICD-10-CM | POA: Diagnosis not present

## 2016-10-31 DIAGNOSIS — N926 Irregular menstruation, unspecified: Secondary | ICD-10-CM | POA: Diagnosis not present

## 2016-10-31 DIAGNOSIS — R102 Pelvic and perineal pain: Secondary | ICD-10-CM | POA: Diagnosis not present

## 2016-10-31 DIAGNOSIS — N809 Endometriosis, unspecified: Secondary | ICD-10-CM | POA: Diagnosis not present

## 2016-10-31 DIAGNOSIS — R1909 Other intra-abdominal and pelvic swelling, mass and lump: Secondary | ICD-10-CM | POA: Diagnosis not present

## 2016-11-30 NOTE — Addendum Note (Signed)
Addendum  created 11/30/16 1704 by Lillia Abed, MD   Sign clinical note

## 2016-11-30 NOTE — Anesthesia Postprocedure Evaluation (Signed)
Anesthesia Post Note  Patient: Joyce Massey  Procedure(s) Performed: Procedure(s) (LRB): OPERATIVE LAPAROSCOPIC OVARIAN CYSTECTOMY AND PERITONEAL BIOPSY (Left)     Anesthesia Post Evaluation  Last Vitals:  Vitals:   07/12/16 1430 07/12/16 1530  BP: 112/64 110/65  Pulse: 79 68  Resp: 12 14  Temp:  36.8 C    Last Pain:  Vitals:   07/12/16 1530  TempSrc:   PainSc: 3                  Jayin Derousse DAVID

## 2016-12-12 DIAGNOSIS — N809 Endometriosis, unspecified: Secondary | ICD-10-CM | POA: Diagnosis not present

## 2016-12-12 DIAGNOSIS — R1909 Other intra-abdominal and pelvic swelling, mass and lump: Secondary | ICD-10-CM | POA: Diagnosis not present

## 2016-12-12 DIAGNOSIS — R102 Pelvic and perineal pain: Secondary | ICD-10-CM | POA: Diagnosis not present

## 2016-12-19 ENCOUNTER — Ambulatory Visit: Payer: Commercial Managed Care - HMO | Admitting: Obstetrics and Gynecology

## 2016-12-19 DIAGNOSIS — L700 Acne vulgaris: Secondary | ICD-10-CM | POA: Diagnosis not present

## 2016-12-19 DIAGNOSIS — D225 Melanocytic nevi of trunk: Secondary | ICD-10-CM | POA: Diagnosis not present

## 2016-12-19 DIAGNOSIS — L2089 Other atopic dermatitis: Secondary | ICD-10-CM | POA: Diagnosis not present

## 2017-02-27 DIAGNOSIS — Z23 Encounter for immunization: Secondary | ICD-10-CM | POA: Diagnosis not present

## 2017-03-19 DIAGNOSIS — M545 Low back pain: Secondary | ICD-10-CM | POA: Diagnosis not present

## 2017-03-30 DIAGNOSIS — J029 Acute pharyngitis, unspecified: Secondary | ICD-10-CM | POA: Diagnosis not present

## 2017-04-22 DIAGNOSIS — L700 Acne vulgaris: Secondary | ICD-10-CM | POA: Diagnosis not present

## 2017-04-22 DIAGNOSIS — L2084 Intrinsic (allergic) eczema: Secondary | ICD-10-CM | POA: Diagnosis not present

## 2017-07-22 DIAGNOSIS — Z01419 Encounter for gynecological examination (general) (routine) without abnormal findings: Secondary | ICD-10-CM | POA: Diagnosis not present

## 2017-07-22 DIAGNOSIS — N926 Irregular menstruation, unspecified: Secondary | ICD-10-CM | POA: Diagnosis not present

## 2017-07-22 DIAGNOSIS — Z124 Encounter for screening for malignant neoplasm of cervix: Secondary | ICD-10-CM | POA: Diagnosis not present

## 2017-09-05 DIAGNOSIS — J029 Acute pharyngitis, unspecified: Secondary | ICD-10-CM | POA: Diagnosis not present

## 2017-09-05 DIAGNOSIS — J0100 Acute maxillary sinusitis, unspecified: Secondary | ICD-10-CM | POA: Diagnosis not present

## 2017-09-19 DIAGNOSIS — L2084 Intrinsic (allergic) eczema: Secondary | ICD-10-CM | POA: Diagnosis not present

## 2017-09-19 DIAGNOSIS — L700 Acne vulgaris: Secondary | ICD-10-CM | POA: Diagnosis not present

## 2017-12-05 DIAGNOSIS — R3989 Other symptoms and signs involving the genitourinary system: Secondary | ICD-10-CM | POA: Diagnosis not present

## 2017-12-05 DIAGNOSIS — R109 Unspecified abdominal pain: Secondary | ICD-10-CM | POA: Diagnosis not present

## 2017-12-09 ENCOUNTER — Other Ambulatory Visit: Payer: Self-pay | Admitting: Physician Assistant

## 2017-12-09 DIAGNOSIS — R109 Unspecified abdominal pain: Secondary | ICD-10-CM

## 2017-12-19 ENCOUNTER — Ambulatory Visit
Admission: RE | Admit: 2017-12-19 | Discharge: 2017-12-19 | Disposition: A | Discharge status: Home / Self Care | Payer: Commercial Managed Care - HMO | Source: Ambulatory Visit | Attending: Physician Assistant | Admitting: Physician Assistant

## 2017-12-19 DIAGNOSIS — R109 Unspecified abdominal pain: Secondary | ICD-10-CM | POA: Diagnosis not present

## 2017-12-19 MED ORDER — IOPAMIDOL (ISOVUE-300) INJECTION 61%
100.0000 mL | Freq: Once | INTRAVENOUS | Status: AC | PRN
  Administered 2017-12-19: 100 mL via INTRAVENOUS

## 2017-12-25 DIAGNOSIS — R109 Unspecified abdominal pain: Secondary | ICD-10-CM | POA: Diagnosis not present

## 2017-12-30 DIAGNOSIS — R0782 Intercostal pain: Secondary | ICD-10-CM | POA: Diagnosis not present

## 2017-12-30 DIAGNOSIS — S233XXD Sprain of ligaments of thoracic spine, subsequent encounter: Secondary | ICD-10-CM | POA: Diagnosis not present

## 2018-01-02 DIAGNOSIS — R0782 Intercostal pain: Secondary | ICD-10-CM | POA: Diagnosis not present

## 2018-01-02 DIAGNOSIS — S233XXD Sprain of ligaments of thoracic spine, subsequent encounter: Secondary | ICD-10-CM | POA: Diagnosis not present

## 2018-01-10 DIAGNOSIS — S233XXD Sprain of ligaments of thoracic spine, subsequent encounter: Secondary | ICD-10-CM | POA: Diagnosis not present

## 2018-01-10 DIAGNOSIS — R0782 Intercostal pain: Secondary | ICD-10-CM | POA: Diagnosis not present

## 2018-01-16 DIAGNOSIS — R0782 Intercostal pain: Secondary | ICD-10-CM | POA: Diagnosis not present

## 2018-01-16 DIAGNOSIS — S233XXD Sprain of ligaments of thoracic spine, subsequent encounter: Secondary | ICD-10-CM | POA: Diagnosis not present

## 2018-01-22 DIAGNOSIS — R0782 Intercostal pain: Secondary | ICD-10-CM | POA: Diagnosis not present

## 2018-01-22 DIAGNOSIS — S233XXD Sprain of ligaments of thoracic spine, subsequent encounter: Secondary | ICD-10-CM | POA: Diagnosis not present

## 2018-01-29 DIAGNOSIS — R0782 Intercostal pain: Secondary | ICD-10-CM | POA: Diagnosis not present

## 2018-01-29 DIAGNOSIS — S233XXD Sprain of ligaments of thoracic spine, subsequent encounter: Secondary | ICD-10-CM | POA: Diagnosis not present

## 2018-02-07 DIAGNOSIS — J309 Allergic rhinitis, unspecified: Secondary | ICD-10-CM | POA: Diagnosis not present

## 2018-02-14 DIAGNOSIS — S233XXD Sprain of ligaments of thoracic spine, subsequent encounter: Secondary | ICD-10-CM | POA: Diagnosis not present

## 2018-02-14 DIAGNOSIS — R0782 Intercostal pain: Secondary | ICD-10-CM | POA: Diagnosis not present

## 2018-02-19 DIAGNOSIS — Z23 Encounter for immunization: Secondary | ICD-10-CM | POA: Diagnosis not present

## 2018-02-21 DIAGNOSIS — R0782 Intercostal pain: Secondary | ICD-10-CM | POA: Diagnosis not present

## 2018-02-21 DIAGNOSIS — S233XXD Sprain of ligaments of thoracic spine, subsequent encounter: Secondary | ICD-10-CM | POA: Diagnosis not present

## 2018-02-28 DIAGNOSIS — S233XXD Sprain of ligaments of thoracic spine, subsequent encounter: Secondary | ICD-10-CM | POA: Diagnosis not present

## 2018-02-28 DIAGNOSIS — R0782 Intercostal pain: Secondary | ICD-10-CM | POA: Diagnosis not present

## 2018-03-07 DIAGNOSIS — R0782 Intercostal pain: Secondary | ICD-10-CM | POA: Diagnosis not present

## 2018-03-07 DIAGNOSIS — S233XXD Sprain of ligaments of thoracic spine, subsequent encounter: Secondary | ICD-10-CM | POA: Diagnosis not present

## 2018-03-14 DIAGNOSIS — S233XXD Sprain of ligaments of thoracic spine, subsequent encounter: Secondary | ICD-10-CM | POA: Diagnosis not present

## 2018-03-14 DIAGNOSIS — R0782 Intercostal pain: Secondary | ICD-10-CM | POA: Diagnosis not present

## 2018-03-21 DIAGNOSIS — S233XXD Sprain of ligaments of thoracic spine, subsequent encounter: Secondary | ICD-10-CM | POA: Diagnosis not present

## 2018-03-21 DIAGNOSIS — R0782 Intercostal pain: Secondary | ICD-10-CM | POA: Diagnosis not present

## 2018-03-28 DIAGNOSIS — R0782 Intercostal pain: Secondary | ICD-10-CM | POA: Diagnosis not present

## 2018-03-28 DIAGNOSIS — S233XXD Sprain of ligaments of thoracic spine, subsequent encounter: Secondary | ICD-10-CM | POA: Diagnosis not present

## 2018-04-02 DIAGNOSIS — R0782 Intercostal pain: Secondary | ICD-10-CM | POA: Diagnosis not present

## 2018-04-02 DIAGNOSIS — S233XXD Sprain of ligaments of thoracic spine, subsequent encounter: Secondary | ICD-10-CM | POA: Diagnosis not present

## 2018-04-11 DIAGNOSIS — R0782 Intercostal pain: Secondary | ICD-10-CM | POA: Diagnosis not present

## 2018-04-11 DIAGNOSIS — S233XXD Sprain of ligaments of thoracic spine, subsequent encounter: Secondary | ICD-10-CM | POA: Diagnosis not present

## 2018-07-19 DIAGNOSIS — H40033 Anatomical narrow angle, bilateral: Secondary | ICD-10-CM | POA: Diagnosis not present

## 2018-07-19 DIAGNOSIS — H04123 Dry eye syndrome of bilateral lacrimal glands: Secondary | ICD-10-CM | POA: Diagnosis not present

## 2018-09-11 DIAGNOSIS — L700 Acne vulgaris: Secondary | ICD-10-CM | POA: Diagnosis not present

## 2018-09-11 DIAGNOSIS — L209 Atopic dermatitis, unspecified: Secondary | ICD-10-CM | POA: Diagnosis not present

## 2019-04-01 MED FILL — ADDERALL XR 30 MG CAP SA: 30 | 30 days supply | Qty: 30 | Fill #0

## 2019-04-02 MED FILL — DEXTROAMP-AMPHETAMIN 20 MG: 20 | 30 days supply | Qty: 30 | Fill #0

## 2019-04-20 MED FILL — SRONYX 0.1-20 MG-MCG TABS: 0.1-20 | 84 days supply | Qty: 112 | Fill #0

## 2019-04-30 MED FILL — ADDERALL XR 30 MG CAP SA: 30 | 30 days supply | Qty: 30 | Fill #0

## 2019-04-30 MED FILL — DEXTROAMP-AMPHETAMIN 20 MG: 20 | 30 days supply | Qty: 30 | Fill #0

## 2019-04-30 MED FILL — ALPRAZolam 0.5 MG TABS: 0.5 | 30 days supply | Qty: 30 | Fill #0

## 2019-05-28 MED FILL — DEXTROAMP-AMPHETAMIN 20 MG: 20 | 30 days supply | Qty: 30 | Fill #0

## 2019-05-28 MED FILL — ADDERALL XR 30 MG CAP SA: 30 | 30 days supply | Qty: 30 | Fill #0

## 2019-05-28 MED FILL — ALPRAZolam 0.5 MG TABS: 0.5 | 30 days supply | Qty: 30 | Fill #1

## 2019-05-28 MED FILL — valACYclovir HCL 1 GM TABS: 1 | 7 days supply | Qty: 30 | Fill #0

## 2019-06-25 MED FILL — DEXTROAMP-AMPHETAMIN 20 MG: 20 | 30 days supply | Qty: 30 | Fill #0

## 2019-06-25 MED FILL — ADDERALL XR 30 MG CAP SA: 30 | 30 days supply | Qty: 30 | Fill #0

## 2019-07-23 MED FILL — AMPHETAMINE-DEXTROAMPHETAMI: 20 | 30 days supply | Qty: 30 | Fill #0

## 2019-07-23 MED FILL — ADDERALL XR 30 MG CAP SA: 30 | 30 days supply | Qty: 30 | Fill #0

## 2019-08-05 DIAGNOSIS — L700 Acne vulgaris: Secondary | ICD-10-CM | POA: Diagnosis not present

## 2019-08-05 DIAGNOSIS — L2084 Intrinsic (allergic) eczema: Secondary | ICD-10-CM | POA: Diagnosis not present

## 2019-08-18 MED FILL — valACYclovir HCL 1 GM TABS: 1 | 7 days supply | Qty: 30 | Fill #0

## 2019-08-20 MED FILL — ADDERALL XR 30 MG CAP SA: 30 | 30 days supply | Qty: 30 | Fill #0

## 2019-08-20 MED FILL — DEXTROAMP-AMPHETAMIN 20 MG: 20 | 30 days supply | Qty: 30 | Fill #0

## 2019-09-21 MED FILL — valACYclovir HCL 1 GM TABS: 1 | 7 days supply | Qty: 30 | Fill #1

## 2019-09-21 MED FILL — DEXTROAMP-AMPHETAMIN 20 MG: 20 | 30 days supply | Qty: 30 | Fill #0

## 2019-09-21 MED FILL — ADDERALL XR 30 MG CAP SA: 30 | 30 days supply | Qty: 30 | Fill #0

## 2019-10-15 DIAGNOSIS — K529 Noninfective gastroenteritis and colitis, unspecified: Secondary | ICD-10-CM | POA: Diagnosis not present

## 2019-10-15 DIAGNOSIS — R110 Nausea: Secondary | ICD-10-CM | POA: Diagnosis not present

## 2019-10-15 MED FILL — ONDANSETRON ODT 4 MG TABLET: 4 | 10 days supply | Qty: 10 | Fill #0

## 2019-10-21 MED FILL — DEXTROAMP-AMPHETAMIN 20 MG: 20 | 30 days supply | Qty: 30 | Fill #0

## 2019-10-21 MED FILL — ADDERALL XR 30 MG CAP SA: 30 | 30 days supply | Qty: 30 | Fill #0

## 2019-10-21 MED FILL — valACYclovir HCL 1 GM TABS: 1 | 7 days supply | Qty: 30 | Fill #2

## 2019-11-10 DIAGNOSIS — F902 Attention-deficit hyperactivity disorder, combined type: Secondary | ICD-10-CM | POA: Diagnosis not present

## 2019-11-23 ENCOUNTER — Inpatient Hospital Stay (HOSPITAL_COMMUNITY)
Admission: AD | Admit: 2019-11-23 | Discharge: 2019-11-24 | Disposition: A | Discharge status: Home / Self Care | Payer: 59 | Attending: Obstetrics & Gynecology | Admitting: Obstetrics & Gynecology

## 2019-11-23 ENCOUNTER — Inpatient Hospital Stay (HOSPITAL_COMMUNITY): Payer: 59

## 2019-11-23 ENCOUNTER — Other Ambulatory Visit: Payer: Self-pay

## 2019-11-23 ENCOUNTER — Encounter (HOSPITAL_COMMUNITY): Payer: Self-pay | Admitting: Obstetrics & Gynecology

## 2019-11-23 DIAGNOSIS — O26891 Other specified pregnancy related conditions, first trimester: Secondary | ICD-10-CM | POA: Diagnosis not present

## 2019-11-23 DIAGNOSIS — R109 Unspecified abdominal pain: Secondary | ICD-10-CM | POA: Diagnosis not present

## 2019-11-23 DIAGNOSIS — Z8711 Personal history of peptic ulcer disease: Secondary | ICD-10-CM | POA: Diagnosis not present

## 2019-11-23 DIAGNOSIS — Z888 Allergy status to other drugs, medicaments and biological substances status: Secondary | ICD-10-CM | POA: Diagnosis not present

## 2019-11-23 DIAGNOSIS — Z3A01 Less than 8 weeks gestation of pregnancy: Secondary | ICD-10-CM | POA: Insufficient documentation

## 2019-11-23 DIAGNOSIS — O3680X0 Pregnancy with inconclusive fetal viability, not applicable or unspecified: Secondary | ICD-10-CM

## 2019-11-23 DIAGNOSIS — O209 Hemorrhage in early pregnancy, unspecified: Secondary | ICD-10-CM | POA: Diagnosis not present

## 2019-11-23 DIAGNOSIS — K219 Gastro-esophageal reflux disease without esophagitis: Secondary | ICD-10-CM | POA: Diagnosis not present

## 2019-11-23 DIAGNOSIS — O4691 Antepartum hemorrhage, unspecified, first trimester: Secondary | ICD-10-CM

## 2019-11-23 DIAGNOSIS — O99341 Other mental disorders complicating pregnancy, first trimester: Secondary | ICD-10-CM | POA: Diagnosis not present

## 2019-11-23 DIAGNOSIS — Z791 Long term (current) use of non-steroidal anti-inflammatories (NSAID): Secondary | ICD-10-CM | POA: Insufficient documentation

## 2019-11-23 DIAGNOSIS — Z6790 Unspecified blood type, Rh positive: Secondary | ICD-10-CM

## 2019-11-23 DIAGNOSIS — O99611 Diseases of the digestive system complicating pregnancy, first trimester: Secondary | ICD-10-CM | POA: Diagnosis not present

## 2019-11-23 DIAGNOSIS — O4690 Antepartum hemorrhage, unspecified, unspecified trimester: Secondary | ICD-10-CM

## 2019-11-23 DIAGNOSIS — Z79899 Other long term (current) drug therapy: Secondary | ICD-10-CM | POA: Insufficient documentation

## 2019-11-23 DIAGNOSIS — F909 Attention-deficit hyperactivity disorder, unspecified type: Secondary | ICD-10-CM | POA: Diagnosis not present

## 2019-11-23 DIAGNOSIS — Z87891 Personal history of nicotine dependence: Secondary | ICD-10-CM | POA: Insufficient documentation

## 2019-11-23 DIAGNOSIS — Z3A00 Weeks of gestation of pregnancy not specified: Secondary | ICD-10-CM | POA: Diagnosis not present

## 2019-11-23 HISTORY — DX: Endometriosis, unspecified: N80.9

## 2019-11-23 LAB — WET PREP, GENITAL
Clue Cells Wet Prep HPF POC: NONE SEEN
Sperm: NONE SEEN
Trich, Wet Prep: NONE SEEN
WBC, Wet Prep HPF POC: NONE SEEN
Yeast Wet Prep HPF POC: NONE SEEN

## 2019-11-23 LAB — COMPREHENSIVE METABOLIC PANEL
ALT: 26 U/L (ref 0–44)
AST: 20 U/L (ref 15–41)
Albumin: 3.8 g/dL (ref 3.5–5.0)
Alkaline Phosphatase: 72 U/L (ref 38–126)
Anion gap: 11 (ref 5–15)
BUN: 14 mg/dL (ref 6–20)
CO2: 24 mmol/L (ref 22–32)
Calcium: 9 mg/dL (ref 8.9–10.3)
Chloride: 104 mmol/L (ref 98–111)
Creatinine, Ser: 0.67 mg/dL (ref 0.44–1.00)
GFR calc Af Amer: >60 mL/min (ref >60)
GFR calc non Af Amer: >60 mL/min (ref >60)
Glucose, Bld: 120 mg/dL — ABNORMAL HIGH (ref 70–99)
Potassium: 3.7 mmol/L (ref 3.5–5.1)
Sodium: 139 mmol/L (ref 135–145)
Total Bilirubin: 0.4 mg/dL (ref 0.3–1.2)
Total Protein: 6.5 g/dL (ref 6.5–8.1)

## 2019-11-23 LAB — CBC
HCT: 39.1 % (ref 36.0–46.0)
Hemoglobin: 13.8 g/dL (ref 12.0–15.0)
MCH: 33.4 pg (ref 26.0–34.0)
MCHC: 35.3 g/dL (ref 30.0–36.0)
MCV: 94.7 fL (ref 80.0–100.0)
Platelets: 246 10*3/uL (ref 150–400)
RBC: 4.13 MIL/uL (ref 3.87–5.11)
RDW: 11.8 % (ref 11.5–15.5)
WBC: 8.6 10*3/uL (ref 4.0–10.5)
nRBC: 0 % (ref 0.0–0.2)

## 2019-11-23 LAB — HCG, QUANTITATIVE, PREGNANCY: hCG, Beta Chain, Quant, S: 314 m[IU]/mL — ABNORMAL HIGH (ref <5)

## 2019-11-23 LAB — ABO/RH: ABO/RH(D): O POS

## 2019-11-23 NOTE — MAU Provider Note (Signed)
History     CSN: [id number]  Arrival date and time: 11/23/19 2127     Chief Complaint  Patient presents with   Abdominal Pain   [name] is a 29 y.o. G1P0000 at [redacted]w[redacted]d who presents to MAU for vaginal bleeding which began 3pm today. Patient reports bright red bleeding only when wiping. Patient reports she worse a panty liner and only saw a few spots on it.  Passing blood clots? no Blood soaking clothes? no Lightheaded/dizzy? no Significant pelvic pain or cramping? Diffuse cramping, also describes as pressure, stretching, tugging Passed any tissue? no  Current pregnancy problems? Pt has not yet been seen Blood Type? O Positive Allergies? Latex, tape Current medications? PNVs, Flonase Current PNC? [facility], not yet established  Pt denies vaginal discharge/odor/itching. Pt denies N/V, abdominal pain, constipation, diarrhea, or urinary problems. Pt denies fever, chills, fatigue, sweating or changes in appetite. Pt denies SOB or chest pain. Pt denies dizziness, HA, light-headedness, weakness.   OB History    Gravida  1   Para  0   Term  0   Preterm  0   AB  0   Living  0     SAB  0   TAB  0   Ectopic  0   Multiple  0   Live Births  0           Past Medical History:  Diagnosis Date   ADHD (attention deficit hyperactivity disorder)    Anxiety    Endometriosis, mild    GERD (gastroesophageal reflux disease)    History of gastric ulcer    Pelvic pain     Past Surgical History:  Procedure Laterality Date   LAPAROSCOPIC CHOLECYSTECTOMY  2012  approx.   LAPAROSCOPIC OVARIAN CYSTECTOMY Left 07/12/2016   Procedure: OPERATIVE LAPAROSCOPIC OVARIAN CYSTECTOMY AND PERITONEAL BIOPSY;  Surgeon: Eldred Manges, MD;  Location: [facility];  Service: Gynecology;  Laterality: Left;    Family History  Problem Relation Age of Onset   Hypertension Mother    Hyperlipidemia Father    Heart disease Maternal Grandmother      Social History   Tobacco Use   Smoking status: Former Smoker    Packs/day: 0.50    Years: 5.00    Pack years: 2.50    Types: Cigarettes    Quit date: 07/09/2014    Years since quitting: 5.3   Smokeless tobacco: Never Used  Substance Use Topics   Alcohol use: Not Currently    Alcohol/week: 2.0 standard drinks    Types: 2 Standard drinks or equivalent per week    Comment: occasional   Drug use: No    Allergies:  Allergies  Allergen Reactions   Latex Rash   Tape Rash    Some tapes cause rashes (adhesives may be the reason)    Medications Prior to Admission  Medication Sig Dispense Refill Last Dose   Crisaborole (EUCRISA) 2 % OINT Apply topically 2 (two) times daily as needed.    Past Week at Unknown time   fluticasone (FLONASE) 50 MCG/ACT nasal spray Place 1 spray into both nostrils daily.      Prenatal Vit-Fe Fumarate-FA (PRENATAL MULTIVITAMIN) TABS tablet Take 1 tablet by mouth daily at 12 noon.      amphetamine-dextroamphetamine (ADDERALL) 20 MG tablet Take 1 tablet by mouth 2 (two) times daily. Am and then 4-6 hours later  0    benzonatate (TESSALON) 100 MG capsule Take 1 capsule (100 mg  total) by mouth every 8 (eight) hours. 21 capsule 0    cyclobenzaprine (FLEXERIL) 10 MG tablet Take 10 mg by mouth 3 (three) times daily as needed for muscle spasms.      HYDROcodone-acetaminophen (NORCO/VICODIN) 5-325 MG tablet Take 1 tablet by mouth every 4 (four) hours as needed for moderate pain. 20 tablet 0    ibuprofen (ADVIL,MOTRIN) 600 MG tablet Ibuprofen 600 mg orally every 6 hours for 3 days, then every 6 hours as needed for pain 60 tablet 0    loratadine (CLARITIN) 10 MG tablet Take 10 mg by mouth every morning.    More than a month at Unknown time   Multiple Vitamins-Calcium (ONE-A-DAY WOMENS FORMULA PO) Take 1 tablet by mouth daily.      norethindrone-ethinyl estradiol 1/35 (ORTHO-NOVUM, NORTREL,CYCLAFEM) tablet Take 1 tablet by mouth every evening.       ondansetron (ZOFRAN ODT) 4 MG disintegrating tablet Take 1 tablet (4 mg total) by mouth every 8 (eight) hours as needed for nausea or vomiting. 20 tablet 0    ranitidine (ZANTAC) 150 MG tablet Take 150 mg by mouth as needed for heartburn.   More than a month at Unknown time   sennosides-docusate sodium (SENOKOT-S) 8.6-50 MG tablet Take 2 tablets by mouth daily. 60 tablet 0     Review of Systems  Constitutional: Negative for chills, diaphoresis, fatigue and fever.  Eyes: Negative for visual disturbance.  Respiratory: Negative for shortness of breath.   Cardiovascular: Negative for chest pain.  Gastrointestinal: Negative for abdominal pain, constipation, diarrhea, nausea and vomiting.  Genitourinary: Positive for pelvic pain (mild cramping) and vaginal bleeding (spotting). Negative for dysuria, flank pain, frequency, urgency and vaginal discharge.  Neurological: Negative for dizziness, weakness, light-headedness and headaches.   Physical Exam   Blood pressure 127/83, pulse 81, temperature 98.8 F (37.1 C), temperature source Oral, resp. rate 20, height 5\' 7"  (1.702 m), weight 72.1 kg, last menstrual period 10/12/2019.  Patient Vitals for the past 24 hrs:  BP Temp Temp src Pulse Resp Height Weight  11/23/19 2149 127/83 98.8 F (37.1 C) Oral 81 20 5\' 7"  (1.702 m) 72.1 kg   Physical Exam Constitutional:      General: She is not in acute distress.    Appearance: She is well-developed. She is not diaphoretic.  HENT:     Head: Normocephalic and atraumatic.  Pulmonary:     Effort: Pulmonary effort is normal.  Abdominal:     General: There is no distension.     Palpations: There is no mass.     Tenderness: There is no abdominal tenderness. There is no guarding or rebound.  Neurological:     Mental Status: She is alert and oriented to person, place, and time.  Psychiatric:        Mood and Affect: Mood normal.        Behavior: Behavior normal.        Thought Content: Thought content  normal.        Judgment: Judgment normal.    Results for orders placed or performed during the hospital encounter of 11/23/19 (from the past 24 hour(s))  CBC     Status: None   Collection Time: 11/23/19 10:29 PM  Result Value Ref Range   WBC 8.6 4.0 - 10.5 K/uL   RBC 4.13 3.87 - 5.11 MIL/uL   Hemoglobin 13.8 12.0 - 15.0 g/dL   HCT 39.1 36 - 46 %   MCV 94.7 80.0 - 100.0 fL  MCH 33.4 26.0 - 34.0 pg   MCHC 35.3 30.0 - 36.0 g/dL   RDW 11.8 11.5 - 15.5 %   Platelets 246 150 - 400 K/uL   nRBC 0.0 0.0 - 0.2 %  Comprehensive metabolic panel     Status: Abnormal   Collection Time: 11/23/19 10:29 PM  Result Value Ref Range   Sodium 139 135 - 145 mmol/L   Potassium 3.7 3.5 - 5.1 mmol/L   Chloride 104 98 - 111 mmol/L   CO2 24 22 - 32 mmol/L   Glucose, Bld 120 (H) 70 - 99 mg/dL   BUN 14 6 - 20 mg/dL   Creatinine, Ser 0.67 0.44 - 1.00 mg/dL   Calcium 9.0 8.9 - 10.3 mg/dL   Total Protein 6.5 6.5 - 8.1 g/dL   Albumin 3.8 3.5 - 5.0 g/dL   AST 20 15 - 41 U/L   ALT 26 0 - 44 U/L   Alkaline Phosphatase 72 38 - 126 U/L   Total Bilirubin 0.4 0.3 - 1.2 mg/dL   GFR calc non Af Amer >60 >60 mL/min   GFR calc Af Amer >60 >60 mL/min   Anion gap 11 5 - 15  hCG, quantitative, pregnancy     Status: Abnormal   Collection Time: 11/23/19 10:29 PM  Result Value Ref Range   hCG, Beta Chain, Quant, S 314 (H) <5 mIU/mL  ABO/Rh     Status: None   Collection Time: 11/23/19 10:29 PM  Result Value Ref Range   ABO/RH(D) O POS    No rh immune globuloin      NOT A RH IMMUNE GLOBULIN CANDIDATE, PT RH POSITIVE Performed at [facility] [address] [place], [place] [address]   Wet prep, genital     Status: None   Collection Time: 11/23/19 10:38 PM   Specimen: Vaginal  Result Value Ref Range   Yeast Wet Prep HPF POC NONE SEEN NONE SEEN   Trich, Wet Prep NONE SEEN NONE SEEN   Clue Cells Wet Prep HPF POC NONE SEEN NONE SEEN   WBC, Wet Prep HPF POC NONE SEEN NONE SEEN   Sperm NONE SEEN    US OB  LESS THAN 14 WEEKS WITH OB TRANSVAGINAL  Result Date: 11/23/2019 CLINICAL DATA:  Initial evaluation for acute vaginal bleeding, early pregnancy. EXAM: OBSTETRIC <14 WK [place] AND TRANSVAGINAL OB US TECHNIQUE: Both transabdominal and transvaginal ultrasound examinations were performed for complete evaluation of the gestation as well as the maternal uterus, adnexal regions, and pelvic cul-de-sac. Transvaginal technique was performed to assess early pregnancy. COMPARISON:  None. FINDINGS: Intrauterine gestational sac: Negative. Yolk sac:  Negative. Embryo:  Negative. Cardiac Activity: Negative. Subchorionic hemorrhage:  None visualized. Maternal uterus/adnexae: Right ovary within normal limits. Left ovary not visualized. No adnexal mass or free fluid. IMPRESSION: 1. Early pregnancy with no discrete IUP or adnexal mass identified. Finding is consistent with a pregnancy of unknown anatomic location. Differential considerations include IUP to early to visualize, recent SAB, or possibly occult ectopic pregnancy. Close clinical monitoring with serial beta HCGs and close interval follow-up ultrasound recommended as clinically warranted. 2. No other acute maternal uterine or adnexal abnormality identified. Electronically Signed   By: Jeannine Boga M.D.   On: 11/23/2019 23:01    MAU Course  Procedures  MDM -r/o ectopic -UA: pending at time of discharge, urine sent for culture -CBC: WNL -CMP: WNL -[place]: PUL -hCG: 314 -ABO: O Positive -WetPrep: WNL -GC/CT collected -pt discharged to home  in stable condition  Orders Placed This Encounter  Procedures   Wet prep, genital    Standing Status:   Standing    Number of Occurrences:   1   Culture, OB Urine    Standing Status:   Standing    Number of Occurrences:   1   US OB LESS THAN 14 WEEKS WITH OB TRANSVAGINAL    Standing Status:   Standing    Number of Occurrences:   1    Order Specific Question:   Symptom/Reason for Exam    Answer:   Vaginal  bleeding in pregnancy [[id number]]   CBC    Standing Status:   Standing    Number of Occurrences:   1   Comprehensive metabolic panel    Standing Status:   Standing    Number of Occurrences:   1   hCG, quantitative, pregnancy    Standing Status:   Standing    Number of Occurrences:   1   Urinalysis, Routine w reflex microscopic    Standing Status:   Standing    Number of Occurrences:   1   ABO/Rh    Standing Status:   Standing    Number of Occurrences:   1   Discharge patient    Order Specific Question:   Discharge disposition    Answer:   01-Home or Self Care [1]    Order Specific Question:   Discharge patient date    Answer:   11/24/2019    Assessment and Plan   1. Pregnancy of unknown anatomic location   2. Vaginal bleeding in pregnancy   3. Blood type, Rh positive     Allergies as of 11/24/2019      Reactions   Latex Rash   Tape Rash   Some tapes cause rashes (adhesives may be the reason)      Medication List    STOP taking these medications   ibuprofen 600 MG tablet Commonly known as: ADVIL   norethindrone-ethinyl estradiol 1/35 tablet Commonly known as: ORTHO-NOVUM   ondansetron 4 MG disintegrating tablet Commonly known as: Zofran ODT     TAKE these medications   amphetamine-dextroamphetamine 20 MG tablet Commonly known as: ADDERALL Take 1 tablet by mouth 2 (two) times daily. Am and then 4-6 hours later   benzonatate 100 MG capsule Commonly known as: TESSALON Take 1 capsule (100 mg total) by mouth every 8 (eight) hours.   cyclobenzaprine 10 MG tablet Commonly known as: FLEXERIL Take 10 mg by mouth 3 (three) times daily as needed for muscle spasms.   Eucrisa 2 % Oint Generic drug: Crisaborole Apply topically 2 (two) times daily as needed.   fluticasone 50 MCG/ACT nasal spray Commonly known as: FLONASE Place 1 spray into both nostrils daily.   HYDROcodone-acetaminophen 5-325 MG tablet Commonly known as: NORCO/VICODIN Take 1 tablet by mouth  every 4 (four) hours as needed for moderate pain.   loratadine 10 MG tablet Commonly known as: CLARITIN Take 10 mg by mouth every morning.   ONE-A-DAY WOMENS FORMULA PO Take 1 tablet by mouth daily.   prenatal multivitamin Tabs tablet Take 1 tablet by mouth daily at 12 noon.   ranitidine 150 MG tablet Commonly known as: ZANTAC Take 150 mg by mouth as needed for heartburn.   sennosides-docusate sodium 8.6-50 MG tablet Commonly known as: SENOKOT-S Take 2 tablets by mouth daily.       -will call with culture results, if positive -safe meds in pregnancy list given -  discussed ectopic vs. SAB vs. miscarriage -strict ectopic precautions given -return MAU precautions -f/u on 11/26/2019 at 8am for repeat hCG -pt discharged to home in stable condition  Elmyra Ricks E Muhanad Torosyan 11/24/2019, 12:26 AM

## 2019-11-23 NOTE — MAU Note (Addendum)
PT SAYS SPOTTING STARTED THIS AFTERNOON- AND HAS BLEEDING - WHEN SHE WIPES AND IN TOILET.  PANTYLINER - ON - SPOTS. CRAMPS - MILD AND PRESSURE . HAS AN APPOINTMENT WITH DR MORRIS- 7-27.

## 2019-11-24 DIAGNOSIS — K219 Gastro-esophageal reflux disease without esophagitis: Secondary | ICD-10-CM | POA: Diagnosis not present

## 2019-11-24 DIAGNOSIS — R109 Unspecified abdominal pain: Secondary | ICD-10-CM | POA: Diagnosis not present

## 2019-11-24 DIAGNOSIS — Z8711 Personal history of peptic ulcer disease: Secondary | ICD-10-CM | POA: Diagnosis not present

## 2019-11-24 DIAGNOSIS — O99611 Diseases of the digestive system complicating pregnancy, first trimester: Secondary | ICD-10-CM | POA: Diagnosis not present

## 2019-11-24 DIAGNOSIS — O26891 Other specified pregnancy related conditions, first trimester: Secondary | ICD-10-CM | POA: Diagnosis not present

## 2019-11-24 DIAGNOSIS — O99341 Other mental disorders complicating pregnancy, first trimester: Secondary | ICD-10-CM | POA: Diagnosis not present

## 2019-11-24 DIAGNOSIS — O209 Hemorrhage in early pregnancy, unspecified: Secondary | ICD-10-CM | POA: Diagnosis not present

## 2019-11-24 DIAGNOSIS — F909 Attention-deficit hyperactivity disorder, unspecified type: Secondary | ICD-10-CM | POA: Diagnosis not present

## 2019-11-24 DIAGNOSIS — Z3A01 Less than 8 weeks gestation of pregnancy: Secondary | ICD-10-CM | POA: Diagnosis not present

## 2019-11-24 LAB — URINALYSIS, ROUTINE W REFLEX MICROSCOPIC
Bilirubin Urine: NEGATIVE
Glucose, UA: NEGATIVE mg/dL
Ketones, ur: NEGATIVE mg/dL
Leukocytes,Ua: NEGATIVE
Nitrite: NEGATIVE
Protein, ur: NEGATIVE mg/dL
Specific Gravity, Urine: 1.004 — ABNORMAL LOW (ref 1.005–1.030)
pH: 6 (ref 5.0–8.0)

## 2019-11-24 LAB — GC/CHLAMYDIA PROBE AMP (~~LOC~~) NOT AT ARMC
Chlamydia: NEGATIVE
Comment: NEGATIVE
Comment: NORMAL
Neisseria Gonorrhea: NEGATIVE

## 2019-11-24 NOTE — Discharge Instructions (Signed)
Ectopic Pregnancy ° °An ectopic pregnancy is when the fertilized egg attaches (implants) outside the uterus. Most ectopic pregnancies occur in one of the tubes where eggs travel from the ovary to the uterus (fallopian tubes), but the implanting can occur in other locations. In rare cases, ectopic pregnancies occur on the ovary, intestine, pelvis, abdomen, or cervix. In an ectopic pregnancy, the fertilized egg does not have the ability to develop into a normal, healthy baby. °A ruptured ectopic pregnancy is one in which tearing or bursting of a fallopian tube causes internal bleeding. Often, there is intense lower abdominal pain, and vaginal bleeding sometimes occurs. Having an ectopic pregnancy can be life-threatening. If this dangerous condition is not treated, it can lead to blood loss, shock, or even death. °What are the causes? °The most common cause of this condition is damage to one of the fallopian tubes. A fallopian tube may be narrowed or blocked, and that keeps the fertilized egg from reaching the uterus. °What increases the risk? °This condition is more likely to develop in women of childbearing age who have different levels of risk. The levels of risk can be divided into three categories. °High risk °· You have gone through infertility treatment. °· You have had an ectopic pregnancy before. °· You have had surgery on the fallopian tubes, or another surgical procedure, such as an abortion. °· You have had surgery to have the fallopian tubes tied (tubal ligation). °· You have problems or diseases of the fallopian tubes. °· You have been exposed to diethylstilbestrol (DES). This medicine was used until 1971, and it had effects on babies whose mothers took the medicine. °· You become pregnant while using an IUD (intrauterine device) for birth control. °Moderate risk °· You have a history of infertility. °· You have had an STI (sexually transmitted infection). °· You have a history of pelvic inflammatory  disease (PID). °· You have scarring from endometriosis. °· You have multiple sexual partners. °· You smoke. °Low risk °· You have had pelvic surgery. °· You use vaginal douches. °· You became sexually active before age 18. °What are the signs or symptoms? °Common symptoms of this condition include normal pregnancy symptoms, such as missing a period, nausea, tiredness, abdominal pain, breast tenderness, and bleeding. However, ectopic pregnancy will have additional symptoms, such as: °· Pain with intercourse. °· Irregular vaginal bleeding or spotting. °· Cramping or pain on one side or in the lower abdomen. °· Fast heartbeat, low blood pressure, and sweating. °· Passing out while having a bowel movement. °Symptoms of a ruptured ectopic pregnancy and internal bleeding may include: °· Sudden, severe pain in the abdomen and pelvis. °· Dizziness, weakness, light-headedness, or fainting. °· Pain in the shoulder or neck area. °How is this diagnosed? °This condition is diagnosed by: °· A pelvic exam to locate pain or a mass in the abdomen. °· A pregnancy test. This blood test checks for the presence as well as the specific level of pregnancy hormone in the bloodstream. °· Ultrasound. This is performed if a pregnancy test is positive. In this test, a probe is inserted into the vagina. The probe will detect a fetus, possibly in a location other than the uterus. °· Taking a sample of uterus tissue (dilation and curettage, or D&C). °· Surgery to perform a visual exam of the inside of the abdomen using a thin, lighted tube that has a tiny camera on the end (laparoscope). °· Culdocentesis. This procedure involves inserting a needle at the top of   the vagina, behind the uterus. If blood is present in this area, it may indicate that a fallopian tube is torn. How is this treated? This condition is treated with medicine or surgery. Medicine  An injection of a medicine (methotrexate) may be given to cause the pregnancy tissue to be  absorbed. This medicine may save your fallopian tube. It may be given if: ? The diagnosis is made early, with no signs of active bleeding. ? The fallopian tube has not ruptured. ? You are considered to be a good candidate for the medicine. Usually, pregnancy hormone blood levels are checked after methotrexate treatment. This is to be sure that the medicine is effective. It may take 4-6 weeks for the pregnancy to be absorbed. Most pregnancies will be absorbed by 3 weeks. Surgery  A laparoscope may be used to remove the pregnancy tissue.  If severe internal bleeding occurs, a larger cut (incision) may be made in the lower abdomen (laparotomy) to remove the fetus and placenta. This is done to stop the bleeding.  Part or all of the fallopian tube may be removed (salpingectomy) along with the fetus and placenta. The fallopian tube may also be repaired during the surgery.  In very rare circumstances, removal of the uterus (hysterectomy) may be required.  After surgery, pregnancy hormone testing may be done to be sure that there is no pregnancy tissue left. Whether your treatment is medicine or surgery, you may receive a Rho (D) immune globulin shot to prevent problems with any future pregnancy. This shot may be given if:  You are Rh-negative and the baby's father is Rh-positive.  You are Rh-negative and you do not know the Rh type of the baby's father. Follow these instructions at home:  Rest and limit your activity after the procedure for as long as told by your health care provider.  Until your health care provider says that it is safe: ? Do not lift anything that is heavier than 10 lb (4.5 kg), or the limit that your health care provider tells you. ? Avoid physical exercise and any movement that requires effort (is strenuous).  To help prevent constipation: ? Eat a healthy diet that includes fruits, vegetables, and whole grains. ? Drink 6-8 glasses of water per day. Get help right away  if:  You develop worsening pain that is not relieved by medicine.  You have: ? A fever or chills. ? Vaginal bleeding. ? Redness and swelling at the incision site. ? Nausea and vomiting.  You feel dizzy or weak.  You feel light-headed or you faint. This information is not intended to replace advice given to you by your health care provider. Make sure you discuss any questions you have with your health care provider. Document Revised: 04/19/2017 Document Reviewed: 12/07/2015 Elsevier Patient Education  [place] A miscarriage is the loss of an unborn baby (fetus) before the 20th week of pregnancy. Most miscarriages happen during the first 3 months of pregnancy. Sometimes, a miscarriage can happen before a woman knows that she is pregnant. Having a miscarriage can be an emotional experience. If you have had a miscarriage, talk with your health care provider about any questions you may have about miscarrying, the grieving process, and your plans for future pregnancy. What are the causes? A miscarriage may be caused by:  Problems with the genes or chromosomes of the fetus. These problems make it impossible for the baby to develop normally. They are  often the result of random errors that occur early in the development of the baby, and are not passed from parent to child (not inherited).  Infection of the cervix or uterus.  Conditions that affect hormone balance in the body.  Problems with the cervix, such as the cervix opening and thinning before pregnancy is at term (cervical insufficiency).  Problems with the uterus. These may include: ? A uterus with an abnormal shape. ? Fibroids in the uterus. ? Congenital abnormalities. These are problems that were present at birth.  Certain medical conditions.  Smoking, drinking alcohol, or using drugs.  Injury (trauma). In many cases, the cause of a miscarriage is not known. What are the signs or  symptoms? Symptoms of this condition include:  Vaginal bleeding or spotting, with or without cramps or pain.  Pain or cramping in the abdomen or lower back.  Passing fluid, tissue, or blood clots from the vagina. How is this diagnosed? This condition may be diagnosed based on:  A physical exam.  Ultrasound.  Blood tests.  Urine tests. How is this treated? Treatment for a miscarriage is sometimes not necessary if you naturally pass all the tissue that was in your uterus. If necessary, this condition may be treated with:  Dilation and curettage (D&C). This is a procedure in which the cervix is stretched open and the lining of the uterus (endometrium) is scraped. This is done only if tissue from the fetus or placenta remains in the body (incomplete miscarriage).  Medicines, such as: ? Antibiotic medicine, to treat infection. ? Medicine to help the body pass any remaining tissue. ? Medicine to reduce (contract) the size of the uterus. These medicines may be given if you have a lot of bleeding. If you have Rh negative blood and your baby was Rh positive, you will need a shot of a medicine called Rh immunoglobulinto protect your future babies from Rh blood problems. "Rh-negative" and "Rh-positive" refer to whether or not the blood has a specific protein found on the surface of red blood cells (Rh factor). Follow these instructions at home: Medicines   Take over-the-counter and prescription medicines only as told by your health care provider.  If you were prescribed antibiotic medicine, take it as told by your health care provider. Do not stop taking the antibiotic even if you start to feel better.  Do not take NSAIDs, such as aspirin and ibuprofen, unless they are approved by your health care provider. These medicines can cause bleeding. Activity  Rest as directed. Ask your health care provider what activities are safe for you.  Have someone help with home and family  responsibilities during this time. General instructions  Keep track of the number of sanitary pads you use each day and how soaked (saturated) they are. Write down this information.  Monitor the amount of tissue or blood clots that you pass from your vagina. Save any large amounts of tissue for your health care provider to examine.  Do not use tampons, douche, or have sex until your health care provider approves.  To help you and your partner with the process of grieving, talk with your health care provider or seek counseling.  When you are ready, meet with your health care provider to discuss any important steps you should take for your health. Also, discuss steps you should take to have a healthy pregnancy in the future.  Keep all follow-up visits as told by your health care provider. This is important. Where to find more  information  The American Congress of Obstetricians and Gynecologists: www.acog.org  [place] Department of Health and [place], systems of Women's Health: [place] Contact a health care provider if:  You have a fever or chills.  You have a foul smelling vaginal discharge.  You have more bleeding instead of less. Get help right away if:  You have severe cramps or pain in your back or abdomen.  You pass blood clots or tissue from your vagina that is walnut-sized or larger.  You soak more than 1 regular sanitary pad in an hour.  You become light-headed or weak.  You pass out.  You have feelings of sadness that take over your thoughts, or you have thoughts of hurting yourself. Summary  Most miscarriages happen in the first 3 months of pregnancy. Sometimes miscarriage happens before a woman even knows that she is pregnant.  Follow your health care provider's instruction for home care. Keep all follow-up appointments.  To help you and your partner with the process of grieving, talk with your health care provider or seek counseling. This  information is not intended to replace advice given to you by your health care provider. Make sure you discuss any questions you have with your health care provider. Document Revised: 08/29/2018 Document Reviewed: 06/12/2016 Elsevier Patient Education  [place] of Pregnancy The first trimester of pregnancy is from week 1 until the end of week 13 (months 1 through 3). A week after a sperm fertilizes an egg, the egg will implant on the wall of the uterus. This embryo will begin to develop into a baby. Genes from you and your partner will form the baby. The female genes will determine whether the baby will be a boy or a girl. At 6-8 weeks, the eyes and face will be formed, and the heartbeat can be seen on ultrasound. At the end of 12 weeks, all the baby's organs will be formed. Now that you are pregnant, you will want to do everything you can to have a healthy baby. Two of the most important things are to get good prenatal care and to follow your health care provider's instructions. Prenatal care is all the medical care you receive before the baby's birth. This care will help prevent, find, and treat any problems during the pregnancy and childbirth. Body changes during your first trimester Your body goes through many changes during pregnancy. The changes vary from woman to woman.  You may gain or lose a couple of pounds at first.  You may feel sick to your stomach (nauseous) and you may throw up (vomit). If the vomiting is uncontrollable, call your health care provider.  You may tire easily.  You may develop headaches that can be relieved by medicines. All medicines should be approved by your health care provider.  You may urinate more often. Painful urination may mean you have a bladder infection.  You may develop heartburn as a result of your pregnancy.  You may develop constipation because certain hormones are causing the muscles that push stool through  your intestines to slow down.  You may develop hemorrhoids or swollen veins (varicose veins).  Your breasts may begin to grow larger and become tender. Your nipples may stick out more, and the tissue that surrounds them (areola) may become darker.  Your gums may bleed and may be sensitive to brushing and flossing.  Dark spots or blotches (chloasma, mask of pregnancy) may develop on your  face. This will likely fade after the baby is born.  Your menstrual periods will stop.  You may have a loss of appetite.  You may develop cravings for certain kinds of food.  You may have changes in your emotions from day to day, such as being excited to be pregnant or being concerned that something may go wrong with the pregnancy and baby.  You may have more vivid and strange dreams.  You may have changes in your hair. These can include thickening of your hair, rapid growth, and changes in texture. Some women also have hair loss during or after pregnancy, or hair that feels dry or thin. Your hair will most likely return to normal after your baby is born. What to expect at prenatal visits During a routine prenatal visit:  You will be weighed to make sure you and the baby are growing normally.  Your blood pressure will be taken.  Your abdomen will be measured to track your baby's growth.  The fetal heartbeat will be listened to between weeks 10 and 14 of your pregnancy.  Test results from any previous visits will be discussed. Your health care provider may ask you:  How you are feeling.  If you are feeling the baby move.  If you have had any abnormal symptoms, such as leaking fluid, bleeding, severe headaches, or abdominal cramping.  If you are using any tobacco products, including cigarettes, chewing tobacco, and electronic cigarettes.  If you have any questions. Other tests that may be performed during your first trimester include:  Blood tests to find your blood type and to check for  the presence of any previous infections. The tests will also be used to check for low iron levels (anemia) and protein on red blood cells (Rh antibodies). Depending on your risk factors, or if you previously had diabetes during pregnancy, you may have tests to check for high blood sugar that affects pregnant women (gestational diabetes).  Urine tests to check for infections, diabetes, or protein in the urine.  An ultrasound to confirm the proper growth and development of the baby.  Fetal screens for spinal cord problems (spina bifida) and Down syndrome.  HIV (human immunodeficiency virus) testing. Routine prenatal testing includes screening for HIV, unless you choose not to have this test.  You may need other tests to make sure you and the baby are doing well. Follow these instructions at home: Medicines  Follow your health care provider's instructions regarding medicine use. Specific medicines may be either safe or unsafe to take during pregnancy.  Take a prenatal vitamin that contains at least 600 micrograms (mcg) of folic acid.  If you develop constipation, try taking a stool softener if your health care provider approves. Eating and drinking   Eat a balanced diet that includes fresh fruits and vegetables, whole grains, good sources of protein such as meat, eggs, or tofu, and low-fat dairy. Your health care provider will help you determine the amount of weight gain that is right for you.  Avoid raw meat and uncooked cheese. These carry germs that can cause birth defects in the baby.  Eating four or five small meals rather than three large meals a day may help relieve nausea and vomiting. If you start to feel nauseous, eating a few soda crackers can be helpful. Drinking liquids between meals, instead of during meals, also seems to help ease nausea and vomiting.  Limit foods that are high in fat and processed sugars, such as fried and  sweet foods.  To prevent constipation: ? Eat foods  that are high in fiber, such as fresh fruits and vegetables, whole grains, and beans. ? Drink enough fluid to keep your urine clear or pale yellow. Activity  Exercise only as directed by your health care provider. Most women can continue their usual exercise routine during pregnancy. Try to exercise for 30 minutes at least 5 days a week. Exercising will help you: ? Control your weight. ? Stay in shape. ? Be prepared for labor and delivery.  Experiencing pain or cramping in the lower abdomen or lower back is a good sign that you should stop exercising. Check with your health care provider before continuing with normal exercises.  Try to avoid standing for long periods of time. Move your legs often if you must stand in one place for a long time.  Avoid heavy lifting.  Wear low-heeled shoes and practice good posture.  You may continue to have sex unless your health care provider tells you not to. Relieving pain and discomfort  Wear a good support bra to relieve breast tenderness.  Take warm sitz baths to soothe any pain or discomfort caused by hemorrhoids. Use hemorrhoid cream if your health care provider approves.  Rest with your legs elevated if you have leg cramps or low back pain.  If you develop varicose veins in your legs, wear support hose. Elevate your feet for 15 minutes, 3-4 times a day. Limit salt in your diet. Prenatal care  Schedule your prenatal visits by the twelfth week of pregnancy. They are usually scheduled monthly at first, then more often in the last 2 months before delivery.  Write down your questions. Take them to your prenatal visits.  Keep all your prenatal visits as told by your health care provider. This is important. Safety  Wear your seat belt at all times when driving.  Make a list of emergency phone numbers, including numbers for family, friends, the hospital, and police and fire departments. General instructions  Ask your health care provider for  a referral to a local prenatal education class. Begin classes no later than the beginning of month 6 of your pregnancy.  Ask for help if you have counseling or nutritional needs during pregnancy. Your health care provider can offer advice or refer you to specialists for help with various needs.  Do not use hot tubs, steam rooms, or saunas.  Do not douche or use tampons or scented sanitary pads.  Do not cross your legs for long periods of time.  Avoid cat litter boxes and soil used by cats. These carry germs that can cause birth defects in the baby and possibly loss of the fetus by miscarriage or stillbirth.  Avoid all smoking, herbs, alcohol, and medicines not prescribed by your health care provider. Chemicals in these products affect the formation and growth of the baby.  Do not use any products that contain nicotine or tobacco, such as cigarettes and e-cigarettes. If you need help quitting, ask your health care provider. You may receive counseling support and other resources to help you quit.  Schedule a dentist appointment. At home, brush your teeth with a soft toothbrush and be gentle when you floss. Contact a health care provider if:  You have dizziness.  You have mild pelvic cramps, pelvic pressure, or nagging pain in the abdominal area.  You have persistent nausea, vomiting, or diarrhea.  You have a bad smelling vaginal discharge.  You have pain when you urinate.  You  notice increased swelling in your face, hands, legs, or ankles.  You are exposed to fifth disease or chickenpox.  You are exposed to [place] measles (rubella) and have never had it. Get help right away if:  You have a fever.  You are leaking fluid from your vagina.  You have spotting or bleeding from your vagina.  You have severe abdominal cramping or pain.  You have rapid weight gain or loss.  You vomit blood or material that looks like coffee grounds.  You develop a severe headache.  You have  shortness of breath.  You have any kind of trauma, such as from a fall or a car accident. Summary  The first trimester of pregnancy is from week 1 until the end of week 13 (months 1 through 3).  Your body goes through many changes during pregnancy. The changes vary from woman to woman.  You will have routine prenatal visits. During those visits, your health care provider will examine you, discuss any test results you may have, and talk with you about how you are feeling. This information is not intended to replace advice given to you by your health care provider. Make sure you discuss any questions you have with your health care provider. Document Revised: 04/19/2017 Document Reviewed: 04/18/2016 Elsevier Patient Education  2020 [place].        Vaginal Bleeding During Pregnancy, First Trimester  A small amount of bleeding from the vagina (spotting) is relatively common during early pregnancy. It usually stops on its own. Various things may cause bleeding or spotting during early pregnancy. Some bleeding may be related to the pregnancy, and some may not. In many cases, the bleeding is normal and is not a problem. However, bleeding can also be a sign of something serious. Be sure to tell your health care provider about any vaginal bleeding right away. Some possible causes of vaginal bleeding during the first trimester include:  Infection or inflammation of the cervix.  Growths (polyps) on the cervix.  Miscarriage or threatened miscarriage.  Pregnancy tissue developing outside of the uterus (ectopic pregnancy).  A mass of tissue developing in the uterus due to an egg being fertilized incorrectly (molar pregnancy). Follow these instructions at home: Activity  Follow instructions from your health care provider about limiting your activity. Ask what activities are safe for you.  If needed, make plans for someone to help with your regular activities.  Do not have sex or  orgasms until your health care provider says that this is safe. General instructions  Take over-the-counter and prescription medicines only as told by your health care provider.  Pay attention to any changes in your symptoms.  Do not use tampons or douche.  Write down how many pads you use each day, how often you change pads, and how soaked (saturated) they are.  If you pass any tissue from your vagina, save the tissue so you can show it to your health care provider.  Keep all follow-up visits as told by your health care provider. This is important. Contact a health care provider if:  You have vaginal bleeding during any part of your pregnancy.  You have cramps or labor pains.  You have a fever. Get help right away if:  You have severe cramps in your back or abdomen.  You pass large clots or a large amount of tissue from your vagina.  Your bleeding increases.  You feel light-headed or weak, or you faint.  You have chills.  You  are leaking fluid or have a gush of fluid from your vagina. Summary  A small amount of bleeding (spotting) from the vagina is relatively common during early pregnancy.  Various things may cause bleeding or spotting in early pregnancy.  Be sure to tell your health care provider about any vaginal bleeding right away. This information is not intended to replace advice given to you by your health care provider. Make sure you discuss any questions you have with your health care provider. Document Revised: 08/26/2018 Document Reviewed: 08/09/2016 Elsevier Patient Education  [place] Medications in Pregnancy    Acne: Benzoyl Peroxide Salicylic Acid  Backache/Headache: Tylenol: 2 regular strength every 4 hours OR              2 Extra strength every 6 hours  Colds/Coughs/Allergies: Benadryl (alcohol free) 25 mg every 6 hours as needed Breath right strips Claritin Cepacol throat lozenges Chloraseptic  throat spray Cold-Eeze- up to three times per day Cough drops, alcohol free Flonase (by prescription only) Guaifenesin Mucinex Robitussin DM (plain only, alcohol free) Saline nasal spray/drops Sudafed (pseudoephedrine) & Actifed ** use only after [redacted] weeks gestation and if you do not have high blood pressure Tylenol Vicks Vaporub Zinc lozenges Zyrtec   Constipation: Colace Ducolax suppositories Fleet enema Glycerin suppositories Metamucil Milk of magnesia Miralax Senokot Smooth move tea  Diarrhea: Kaopectate Imodium A-D  *NO pepto Bismol  Hemorrhoids: Anusol Anusol HC Preparation H Tucks  Indigestion: Tums Maalox Mylanta Zantac  Pepcid  Insomnia: Benadryl (alcohol free) 25mg  every 6 hours as needed Tylenol PM Unisom, no Gelcaps  Leg Cramps: Tums MagGel  Nausea/Vomiting:  Bonine Dramamine Emetrol Ginger extract Sea bands Meclizine  Nausea medication to take during pregnancy:  Unisom (doxylamine succinate 25 mg tablets) Take one tablet daily at bedtime. If symptoms are not adequately controlled, the dose can be increased to a maximum recommended dose of two tablets daily (1/2 tablet in the morning, 1/2 tablet mid-afternoon and one at bedtime). Vitamin B6 100mg  tablets. Take one tablet twice a day (up to 200 mg per day).  Skin Rashes: Aveeno products Benadryl cream or 25mg  every 6 hours as needed Calamine Lotion 1% cortisone cream  Yeast infection: Gyne-lotrimin 7 Monistat 7   **If taking multiple medications, please check labels to avoid duplicating the same active ingredients **take medication as directed on the label ** Do not exceed 4000 mg of tylenol in 24 hours **Do not take medications that contain aspirin or ibuprofen

## 2019-11-25 LAB — CULTURE, OB URINE: Culture: NO GROWTH

## 2019-11-26 ENCOUNTER — Other Ambulatory Visit: Payer: Self-pay

## 2019-11-26 ENCOUNTER — Other Ambulatory Visit (INDEPENDENT_AMBULATORY_CARE_PROVIDER_SITE_OTHER): Payer: 59

## 2019-11-26 DIAGNOSIS — O4690 Antepartum hemorrhage, unspecified, unspecified trimester: Secondary | ICD-10-CM

## 2019-11-26 LAB — BETA HCG QUANT (REF LAB): hCG Quant: 94 m[IU]/mL

## 2019-11-26 NOTE — Progress Notes (Addendum)
SUBJECTIVE OB presents for STAT HCG, c/o bleeding/spotting when she wipes, lower back and hip pain 1/10.  Denies fever, chills.   PLAN STAT HCG sent to lab, will call patient with results  11:07 AM TC to patient with results, and need for [place] in 1 week per Dr. Roselie Awkward. Ectopic precautions given.  Patient verbalizes understanding and agreement.  HCG scheduled for 12/03/2019 @8 :35 am

## 2019-11-27 MED FILL — ADDERALL XR 30 MG CAP SA: 30 | 30 days supply | Qty: 30 | Fill #0

## 2019-12-02 NOTE — Progress Notes (Signed)
Patient ID: Joyce Massey, female   DOB: 06-19-1990, 29 y.o.   MRN: [id number] Patient was assessed and managed by nursing staff during this encounter. I have reviewed the chart and agree with the documentation and plan. I have also made any necessary editorial changes.  Emeterio Reeve, MD 12/02/2019 6:14 PM

## 2019-12-03 ENCOUNTER — Ambulatory Visit (HOSPITAL_BASED_OUTPATIENT_CLINIC_OR_DEPARTMENT_OTHER): Payer: 59

## 2019-12-03 ENCOUNTER — Other Ambulatory Visit: Payer: Self-pay

## 2019-12-03 ENCOUNTER — Other Ambulatory Visit: Payer: 59

## 2019-12-03 VITALS — BP 116/83 | HR 74 | Wt 158.3 lb

## 2019-12-03 DIAGNOSIS — Z8759 Personal history of other complications of pregnancy, childbirth and the puerperium: Secondary | ICD-10-CM

## 2019-12-03 DIAGNOSIS — O3680X0 Pregnancy with inconclusive fetal viability, not applicable or unspecified: Secondary | ICD-10-CM | POA: Diagnosis not present

## 2019-12-03 DIAGNOSIS — O4690 Antepartum hemorrhage, unspecified, unspecified trimester: Secondary | ICD-10-CM

## 2019-12-03 NOTE — Progress Notes (Signed)
Patient was assessed and managed by nursing staff during this encounter. I have reviewed the chart and agree with the documentation and plan. I have also made any necessary editorial changes.  Griffin Basil, MD 12/03/2019 1:36 PM

## 2019-12-03 NOTE — Progress Notes (Signed)
Pt is here for repeat HCG. Recent SAB, pt reports bleeding and cramping have stopped. Pt desires to TTC again. I advised pt that we will let her know when the HCG result is in and we will follow up with next steps. Pt voices understanding.

## 2019-12-04 ENCOUNTER — Other Ambulatory Visit: Payer: Self-pay

## 2019-12-04 ENCOUNTER — Telehealth: Payer: Self-pay

## 2019-12-04 ENCOUNTER — Inpatient Hospital Stay (HOSPITAL_COMMUNITY)
Admission: AD | Admit: 2019-12-04 | Discharge: 2019-12-04 | Disposition: A | Discharge status: Home / Self Care | Payer: 59 | Attending: Obstetrics and Gynecology | Admitting: Obstetrics and Gynecology

## 2019-12-04 ENCOUNTER — Inpatient Hospital Stay (HOSPITAL_COMMUNITY): Payer: 59

## 2019-12-04 ENCOUNTER — Encounter (HOSPITAL_COMMUNITY): Payer: Self-pay | Admitting: Obstetrics and Gynecology

## 2019-12-04 DIAGNOSIS — O99611 Diseases of the digestive system complicating pregnancy, first trimester: Secondary | ICD-10-CM | POA: Diagnosis not present

## 2019-12-04 DIAGNOSIS — O26891 Other specified pregnancy related conditions, first trimester: Secondary | ICD-10-CM | POA: Diagnosis present

## 2019-12-04 DIAGNOSIS — K219 Gastro-esophageal reflux disease without esophagitis: Secondary | ICD-10-CM | POA: Insufficient documentation

## 2019-12-04 DIAGNOSIS — Z87891 Personal history of nicotine dependence: Secondary | ICD-10-CM | POA: Diagnosis not present

## 2019-12-04 DIAGNOSIS — O99341 Other mental disorders complicating pregnancy, first trimester: Secondary | ICD-10-CM | POA: Diagnosis not present

## 2019-12-04 DIAGNOSIS — Z79899 Other long term (current) drug therapy: Secondary | ICD-10-CM | POA: Diagnosis not present

## 2019-12-04 DIAGNOSIS — Z3A01 Less than 8 weeks gestation of pregnancy: Secondary | ICD-10-CM | POA: Diagnosis not present

## 2019-12-04 DIAGNOSIS — F909 Attention-deficit hyperactivity disorder, unspecified type: Secondary | ICD-10-CM | POA: Diagnosis not present

## 2019-12-04 DIAGNOSIS — O0281 Inappropriate change in quantitative human chorionic gonadotropin (hCG) in early pregnancy: Secondary | ICD-10-CM | POA: Diagnosis not present

## 2019-12-04 DIAGNOSIS — Z91040 Latex allergy status: Secondary | ICD-10-CM | POA: Diagnosis not present

## 2019-12-04 DIAGNOSIS — O009 Unspecified ectopic pregnancy without intrauterine pregnancy: Secondary | ICD-10-CM | POA: Insufficient documentation

## 2019-12-04 LAB — CBC
HCT: 40.1 % (ref 36.0–46.0)
Hemoglobin: 14 g/dL (ref 12.0–15.0)
MCH: 32.6 pg (ref 26.0–34.0)
MCHC: 34.9 g/dL (ref 30.0–36.0)
MCV: 93.3 fL (ref 80.0–100.0)
Platelets: 218 10*3/uL (ref 150–400)
RBC: 4.3 MIL/uL (ref 3.87–5.11)
RDW: 11.8 % (ref 11.5–15.5)
WBC: 9.2 10*3/uL (ref 4.0–10.5)
nRBC: 0 % (ref 0.0–0.2)

## 2019-12-04 LAB — BETA HCG QUANT (REF LAB): hCG Quant: 280 m[IU]/mL

## 2019-12-04 LAB — COMPREHENSIVE METABOLIC PANEL
ALT: 21 U/L (ref 0–44)
AST: 17 U/L (ref 15–41)
Albumin: 4.2 g/dL (ref 3.5–5.0)
Alkaline Phosphatase: 71 U/L (ref 38–126)
Anion gap: 9 (ref 5–15)
BUN: 14 mg/dL (ref 6–20)
CO2: 25 mmol/L (ref 22–32)
Calcium: 9.6 mg/dL (ref 8.9–10.3)
Chloride: 104 mmol/L (ref 98–111)
Creatinine, Ser: 0.61 mg/dL (ref 0.44–1.00)
GFR calc Af Amer: >60 mL/min (ref >60)
GFR calc non Af Amer: >60 mL/min (ref >60)
Glucose, Bld: 98 mg/dL (ref 70–99)
Potassium: 4 mmol/L (ref 3.5–5.1)
Sodium: 138 mmol/L (ref 135–145)
Total Bilirubin: 0.3 mg/dL (ref 0.3–1.2)
Total Protein: 7.2 g/dL (ref 6.5–8.1)

## 2019-12-04 LAB — URINALYSIS, ROUTINE W REFLEX MICROSCOPIC
Bilirubin Urine: NEGATIVE
Glucose, UA: NEGATIVE mg/dL
Hgb urine dipstick: NEGATIVE
Ketones, ur: NEGATIVE mg/dL
Leukocytes,Ua: NEGATIVE
Nitrite: NEGATIVE
Protein, ur: NEGATIVE mg/dL
Specific Gravity, Urine: 1.005 (ref 1.005–1.030)
pH: 5 (ref 5.0–8.0)

## 2019-12-04 LAB — HCG, QUANTITATIVE, PREGNANCY: hCG, Beta Chain, Quant, S: 354 m[IU]/mL — ABNORMAL HIGH (ref <5)

## 2019-12-04 MED ORDER — CYCLOBENZAPRINE HCL 10 MG PO TABS
10.0000 mg | ORAL_TABLET | Freq: Three times a day (TID) | ORAL | 0 refills | Status: DC | PRN
Start: 2019-12-04 — End: 2021-01-26

## 2019-12-04 MED ORDER — CYCLOBENZAPRINE HCL 10 MG PO TABS
10.0000 mg | ORAL_TABLET | Freq: Three times a day (TID) | ORAL | 0 refills | Status: DC | PRN
Start: 2019-12-04 — End: 2019-12-04

## 2019-12-04 MED ORDER — ONDANSETRON HCL 8 MG PO TABS
8.0000 mg | ORAL_TABLET | Freq: Three times a day (TID) | ORAL | 0 refills | Status: DC | PRN
Start: 2019-12-04 — End: 2021-02-02

## 2019-12-04 MED ORDER — ONDANSETRON HCL 8 MG PO TABS
8.0000 mg | ORAL_TABLET | Freq: Three times a day (TID) | ORAL | 0 refills | Status: DC | PRN
Start: 2019-12-04 — End: 2019-12-04

## 2019-12-04 MED ORDER — METHOTREXATE FOR ECTOPIC PREGNANCY
50.0000 mg/m2 | Freq: Once | INTRAMUSCULAR | Status: AC
  Administered 2019-12-04: 95 mg via INTRAMUSCULAR
  Filled 2019-12-04: qty 1

## 2019-12-04 NOTE — Discharge Instructions (Signed)
Methotrexate Treatment for an Ectopic Pregnancy, Care After This sheet gives you information about how to care for yourself after your procedure. Your health care provider may also give you more specific instructions. If you have problems or questions, contact your health care provider. What can I expect after the procedure? After the procedure, it is common to have:  Abdominal cramping.  Vaginal bleeding.  Fatigue.  Nausea.  Vomiting.  Diarrhea. Blood tests will be taken at timed intervals for several days or weeks to check your pregnancy hormone levels. The blood tests will be done until the pregnancy hormone can no longer be detected in the blood. Follow these instructions at home: Activity  Do not have sex until your health care provider approves.  Limit activities that take a lot of effort as told by your health care provider. Medicines  Take over the counter and prescription medicines only as told by your health care provider.  Do not take aspirin, ibuprofen, naproxen, or any other NSAIDs.  Do not take folic acid, prenatal vitamins, or other vitamins that contain folic acid. General instructions   Do not drink alcohol.  Follow instructions from your health care provider on how and when to report any symptoms that may indicate a ruptured ectopic pregnancy.  Keep all follow-up visits as told by your health care provider. This is important. Contact a health care provider if:  You have persistent nausea and vomiting.  You have persistent diarrhea.  You are having a reaction to the medicine, such as: ? Tiredness. ? Skin rash. ? Hair loss. Get help right away if:  Your abdominal or pelvic pain gets worse.  You have more vaginal bleeding.  You feel light-headed or you faint.  You have shortness of breath.  Your heart rate increases.  You develop a cough.  You have chills.  You have a fever. Summary  After the procedure, it is common to have symptoms  of abdominal cramping, vaginal bleeding and fatigue. You may also experience other symptoms.  Blood tests will be taken at timed intervals for several days or weeks to check your pregnancy hormone levels. The blood tests will be done until the pregnancy hormone can no longer be detected in the blood.  Limit strenuous activity as told by your health care provider.  Follow instructions from your health care provider on how and when to report any symptoms that may indicate a ruptured ectopic pregnancy. This information is not intended to replace advice given to you by your health care provider. Make sure you discuss any questions you have with your health care provider. Document Revised: 04/19/2017 Document Reviewed: 06/26/2016 Elsevier Patient Education  [place].   Ectopic Pregnancy  An ectopic pregnancy happens when a fertilized egg grows outside the womb (uterus). The fertilized egg cannot stay alive outside of the womb. This problem often happens in a fallopian tube. It is often caused by damage to the tube. If this problem is found early, you may be treated with medicine that stops the egg from growing. If your tube tears or bursts open (ruptures), you will bleed inside. Often, there is very bad pain in the lower belly. This is an emergency. You will need surgery. Get help right away. Follow these instructions at home: After being treated with medicine or surgery:  Rest and limit your activity for as long as told by your doctor.  Until your doctor says that it is safe: ? Do not lift anything that is heavier than  10 lb (4.5 kg) or the limit that your doctor tells you. ? Avoid exercise and any movement that takes a lot of effort.  To prevent problems when pooping (constipation): ? Eat a healthy diet. This includes:  Fruits.  Vegetables.  Whole grains. ? Drink 6-8 glasses of water a day. Contact a doctor if: Get help right away if:  You have sudden and very bad pain in  your belly.  You have very bad pain in your shoulders or neck.  You have pain that gets worse and is not helped by medicine.  You have: ? A fever or chills. ? Vaginal bleeding. ? Redness or swelling at the site of a surgical cut (incision).  You feel sick to your stomach (nauseous) or you throw up (vomit).  You feel dizzy or weak.  You feel light-headed or you pass out (faint). Summary  An ectopic pregnancy happens when a fertilized egg grows outside the womb (uterus).  If this problem is found early, you may be treated with medicine that stops the egg from growing.  If your tube tears or bursts open (ruptures), you will need surgery. This is an emergency. Get help right away. This information is not intended to replace advice given to you by your health care provider. Make sure you discuss any questions you have with your health care provider. Document Revised: 04/19/2017 Document Reviewed: 05/31/2016 Elsevier Patient Education  2020 [employer].

## 2019-12-04 NOTE — MAU Note (Signed)
Pt presents to MAU with c/o lower left sided abdominal pain that started x 1 week. Pt HCG quant has been followed over the last week. She has not had a follow up US since initial. Pt denies VB.

## 2019-12-04 NOTE — MAU Provider Note (Signed)
History     CSN: [id number]  Arrival date and time: 12/04/19 1519   First Provider Initiated Contact with Patient 12/04/19 1615      Chief Complaint  Patient presents with  . Abdominal Pain   Joyce Massey is a 29 y.o. G1P0000 at [redacted]w[redacted]d who presents today with abdominal pain. She has been followed with serial HCGs due to pregnancy of unknown location. HCG was going down and then went back up again. She did have about 3-4 days of bleeding around 11/26/2019 that has since stopped. The pain has continued and is mostly on the left side.   Pelvic Pain The patient's primary symptoms include pelvic pain. This is a new problem. The current episode started in the past 7 days. The problem occurs intermittently. The problem has been unchanged. The problem affects the left side. She is pregnant. The vaginal discharge was normal. There has been no bleeding.    OB History    Gravida  1   Para  0   Term  0   Preterm  0   AB  0   Living  0     SAB  0   TAB  0   Ectopic  0   Multiple  0   Live Births  0           Past Medical History:  Diagnosis Date  . ADHD (attention deficit hyperactivity disorder)   . Anxiety   . Endometriosis, mild   . GERD (gastroesophageal reflux disease)   . History of gastric ulcer   . Pelvic pain     Past Surgical History:  Procedure Laterality Date  . LAPAROSCOPIC CHOLECYSTECTOMY  2012  approx.  Marland Kitchen LAPAROSCOPIC OVARIAN CYSTECTOMY Left 07/12/2016   Procedure: OPERATIVE LAPAROSCOPIC OVARIAN CYSTECTOMY AND PERITONEAL BIOPSY;  Surgeon: Eldred Manges, MD;  Location: [facility];  Service: Gynecology;  Laterality: Left;    Family History  Problem Relation Age of Onset  . Hypertension Mother   . Hyperlipidemia Father   . Heart disease Maternal Grandmother     Social History   Tobacco Use  . Smoking status: Former Smoker    Packs/day: 0.50    Years: 5.00    Pack years: 2.50    Types: Cigarettes    Quit date: 07/09/2014     Years since quitting: 5.4  . Smokeless tobacco: Never Used  Substance Use Topics  . Alcohol use: Not Currently    Alcohol/week: 2.0 standard drinks    Types: 2 Standard drinks or equivalent per week    Comment: occasional  . Drug use: No    Allergies:  Allergies  Allergen Reactions  . Latex Rash  . Tape Rash    Some tapes cause rashes (adhesives may be the reason)    Medications Prior to Admission  Medication Sig Dispense Refill Last Dose  . amphetamine-dextroamphetamine (ADDERALL) 20 MG tablet Take 1 tablet by mouth 2 (two) times daily. Am and then 4-6 hours later  0 12/04/2019 at Unknown time  . Crisaborole (EUCRISA) 2 % OINT Apply topically 2 (two) times daily as needed.    Past Month at Unknown time  . fluticasone (FLONASE) 50 MCG/ACT nasal spray Place 1 spray into both nostrils daily.   12/03/2019 at Unknown time  . loratadine (CLARITIN) 10 MG tablet Take 10 mg by mouth every morning.    12/03/2019 at Unknown time  . Prenatal Vit-Fe Fumarate-FA (PRENATAL MULTIVITAMIN) TABS tablet Take 1 tablet by mouth daily at  12 noon.   12/04/2019 at Unknown time  . benzonatate (TESSALON) 100 MG capsule Take 1 capsule (100 mg total) by mouth every 8 (eight) hours. 21 capsule 0 More than a month at Unknown time  . cyclobenzaprine (FLEXERIL) 10 MG tablet Take 10 mg by mouth 3 (three) times daily as needed for muscle spasms.   More than a month at Unknown time  . HYDROcodone-acetaminophen (NORCO/VICODIN) 5-325 MG tablet Take 1 tablet by mouth every 4 (four) hours as needed for moderate pain. 20 tablet 0 More than a month at Unknown time  . Multiple Vitamins-Calcium (ONE-A-DAY WOMENS FORMULA PO) Take 1 tablet by mouth daily.   More than a month at Unknown time  . ranitidine (ZANTAC) 150 MG tablet Take 150 mg by mouth as needed for heartburn.   More than a month at Unknown time  . sennosides-docusate sodium (SENOKOT-S) 8.6-50 MG tablet Take 2 tablets by mouth daily. 60 tablet 0 More than a month at  Unknown time    Review of Systems  Genitourinary: Positive for pelvic pain.  All other systems reviewed and are negative.  Physical Exam   Blood pressure 129/82, pulse 85, temperature 98.3 F (36.8 C), temperature source Oral, resp. rate 18, height 5\' 7"  (1.702 m), weight 72.1 kg, last menstrual period 10/12/2019, SpO2 100 %.  Physical Exam Vitals and nursing note reviewed.  HENT:     Head: Normocephalic.  Cardiovascular:     Rate and Rhythm: Normal rate.  Pulmonary:     Effort: Pulmonary effort is normal.  Abdominal:     Tenderness: There is abdominal tenderness in the right upper quadrant and right lower quadrant.  Skin:    General: Skin is warm and dry.  Neurological:     Mental Status: She is alert and oriented to person, place, and time.  Psychiatric:        Mood and Affect: Mood normal.        Behavior: Behavior normal.    Results for orders placed or performed during the hospital encounter of 12/04/19 (from the past 24 hour(s))  CBC     Status: None   Collection Time: 12/04/19  4:00 PM  Result Value Ref Range   WBC 9.2 4.0 - 10.5 K/uL   RBC 4.30 3.87 - 5.11 MIL/uL   Hemoglobin 14.0 12.0 - 15.0 g/dL   HCT 40.1 36 - 46 %   MCV 93.3 80.0 - 100.0 fL   MCH 32.6 26.0 - 34.0 pg   MCHC 34.9 30.0 - 36.0 g/dL   RDW 11.8 11.5 - 15.5 %   Platelets 218 150 - 400 K/uL   nRBC 0.0 0.0 - 0.2 %  Comprehensive metabolic panel     Status: None   Collection Time: 12/04/19  4:00 PM  Result Value Ref Range   Sodium 138 135 - 145 mmol/L   Potassium 4.0 3.5 - 5.1 mmol/L   Chloride 104 98 - 111 mmol/L   CO2 25 22 - 32 mmol/L   Glucose, Bld 98 70 - 99 mg/dL   BUN 14 6 - 20 mg/dL   Creatinine, Ser 0.61 0.44 - 1.00 mg/dL   Calcium 9.6 8.9 - 10.3 mg/dL   Total Protein 7.2 6.5 - 8.1 g/dL   Albumin 4.2 3.5 - 5.0 g/dL   AST 17 15 - 41 U/L   ALT 21 0 - 44 U/L   Alkaline Phosphatase 71 38 - 126 U/L   Total Bilirubin 0.3 0.3 - 1.2 mg/dL  GFR calc non Af Amer >60 >60 mL/min   GFR  calc Af Amer >60 >60 mL/min   Anion gap 9 5 - 15  hCG, quantitative, pregnancy     Status: Abnormal   Collection Time: 12/04/19  4:00 PM  Result Value Ref Range   hCG, Beta Chain, Quant, S 354 (H) <5 mIU/mL  Urinalysis, Routine w reflex microscopic     Status: Abnormal   Collection Time: 12/04/19  4:43 PM  Result Value Ref Range   Color, Urine COLORLESS (A) YELLOW   APPearance CLEAR CLEAR   Specific Gravity, Urine 1.005 1.005 - 1.030   pH 5.0 5.0 - 8.0   Glucose, UA NEGATIVE NEGATIVE mg/dL   Hgb urine dipstick NEGATIVE NEGATIVE   Bilirubin Urine NEGATIVE NEGATIVE   Ketones, ur NEGATIVE NEGATIVE mg/dL   Protein, ur NEGATIVE NEGATIVE mg/dL   Nitrite NEGATIVE NEGATIVE   Leukocytes,Ua NEGATIVE NEGATIVE    US OB Transvaginal  Result Date: 12/04/2019 CLINICAL DATA:  Pregnancy of unknown location, abnormal beta HCG levels, beta HCG 280 yesterday, 94 11/26/2019 EXAM: TRANSVAGINAL OB ULTRASOUND TECHNIQUE: Transvaginal ultrasound was performed for complete evaluation of the gestation as well as the maternal uterus, adnexal regions, and pelvic cul-de-sac. COMPARISON:  11/23/2019 FINDINGS: Intrauterine gestational sac: Single Yolk sac:  Not Visualized. Embryo:  Not Visualized. Cardiac Activity: Not Visualized. MSD: 4.3 mm   5 w   1 d Subchorionic hemorrhage:  None visualized. Maternal uterus/adnexae: Right ovary measures 2.1 x 2.2 x 3.1 cm and the left ovary measures 1.3 x 2.2 x 1.4 cm. There are no adnexal masses. No free fluid. IMPRESSION: 1. Possible early intrauterine gestational sac, but no yolk sac, fetal pole, or cardiac activity yet visualized. Recommend follow-up quantitative B-HCG levels and follow-up US in 14 days to assess viability. This recommendation follows SRU consensus guidelines: Diagnostic Criteria for Nonviable Pregnancy Early in the First Trimester. [place] Med 2013; 889:1694-50 2. No adnexal masses or sonographic evidence of ectopic pregnancy. . Electronically Signed   By: Randa Ngo M.D.   On: 12/04/2019 17:43   MAU Course  Procedures  MDM  5:36pm: DW Dr. Elonda Husky, reviewed labs, ultrasound results and present physical exam. He recommends that we proceed with MTX today.  R/B/A discussed. Plan to proceed with MTX today and FU on Monday for day #4 HCG  Assessment and Plan   1. Ectopic pregnancy without intrauterine pregnancy, unspecified location    DC home Bleeding precautions Ectopic precautions RX: zofran PRN #30. Flexeril PRN #30  Return to MAU as needed FU with OB as planned   Follow-up Information    [facility] Follow up.   Why: Monday 12/07/2019 at 8:00am  Contact information: [address] [id number] [place] DNP, CNM  12/04/19  6:20 PM

## 2019-12-04 NOTE — Telephone Encounter (Signed)
Called pt to notify her of HCG result. After consulting with Dr. Elly Modena she advised pt go to MAU for [place] to rule out ectopic pregnancy. Pt denies any pain or bleeding. Recommendations given to patient to go to MAU, pt voices understanding.

## 2019-12-04 NOTE — MAU Note (Signed)
Pt advised to wait 20 min after receiving injection, ok per provider for her to leave. Advised to return with side effects or further symptoms

## 2019-12-05 MED FILL — CYCLOBENZAPRINE HCL 10 MG T: 10 | 10 days supply | Qty: 30 | Fill #0

## 2019-12-05 MED FILL — ONDANSETRON HCL 8 MG TABLET: 8 | 10 days supply | Qty: 30 | Fill #0

## 2019-12-07 ENCOUNTER — Other Ambulatory Visit: Payer: Self-pay

## 2019-12-07 ENCOUNTER — Other Ambulatory Visit (INDEPENDENT_AMBULATORY_CARE_PROVIDER_SITE_OTHER): Payer: 59

## 2019-12-07 ENCOUNTER — Telehealth: Payer: Self-pay

## 2019-12-07 DIAGNOSIS — O009 Unspecified ectopic pregnancy without intrauterine pregnancy: Secondary | ICD-10-CM

## 2019-12-07 DIAGNOSIS — Z3A00 Weeks of gestation of pregnancy not specified: Secondary | ICD-10-CM

## 2019-12-07 LAB — BETA HCG QUANT (REF LAB): hCG Quant: 320 m[IU]/mL

## 2019-12-07 NOTE — Progress Notes (Signed)
Patient was assessed and managed by nursing staff during this encounter. I have reviewed the chart and agree with the documentation and plan. I have also made any necessary editorial changes.  Mora Bellman, MD 12/07/2019 9:15 AM

## 2019-12-07 NOTE — Telephone Encounter (Signed)
Pt aware of HCG quant test results. Consulted with MD. Pt needs #7 STAT HCG on Thursday. Pt works at [employer] and prefers to get labs drawn there on Thursday.

## 2019-12-07 NOTE — Progress Notes (Signed)
Ms. Longan presents today for #4 STAT HCG Quant. Received MTX 12/04/19, next dose scheduled today. She has no unusual complaints. Pain subsided, denies VB.  LMP: 10/12/19    OBJECTIVE: Appears well, in no apparent distress.  OB History    Gravida  1   Para  0   Term  0   Preterm  0   AB  0   Living  0     SAB  0   TAB  0   Ectopic  0   Multiple  0   Live Births  0          Home UPT Result: Positive STAT HCG Quant result: Pending results I have reviewed the patient's medical, obstetrical, social, and family histories, and medications.   ASSESSMENT: Positive pregnancy unknown location  PLAN: Advised pt we will contact her with a plan once results are in today, pt agrees.

## 2019-12-11 ENCOUNTER — Encounter (HOSPITAL_COMMUNITY): Payer: Self-pay | Admitting: Obstetrics & Gynecology

## 2019-12-11 ENCOUNTER — Other Ambulatory Visit: Payer: Self-pay

## 2019-12-11 ENCOUNTER — Inpatient Hospital Stay (HOSPITAL_COMMUNITY)
Admission: AD | Admit: 2019-12-11 | Discharge: 2019-12-11 | Disposition: A | Discharge status: Home / Self Care | Payer: 59 | Attending: Obstetrics & Gynecology | Admitting: Obstetrics & Gynecology

## 2019-12-11 ENCOUNTER — Inpatient Hospital Stay (HOSPITAL_COMMUNITY): Payer: 59

## 2019-12-11 DIAGNOSIS — K219 Gastro-esophageal reflux disease without esophagitis: Secondary | ICD-10-CM | POA: Insufficient documentation

## 2019-12-11 DIAGNOSIS — N83202 Unspecified ovarian cyst, left side: Secondary | ICD-10-CM | POA: Insufficient documentation

## 2019-12-11 DIAGNOSIS — Z888 Allergy status to other drugs, medicaments and biological substances status: Secondary | ICD-10-CM | POA: Diagnosis not present

## 2019-12-11 DIAGNOSIS — O3481 Maternal care for other abnormalities of pelvic organs, first trimester: Secondary | ICD-10-CM | POA: Insufficient documentation

## 2019-12-11 DIAGNOSIS — O99341 Other mental disorders complicating pregnancy, first trimester: Secondary | ICD-10-CM | POA: Diagnosis not present

## 2019-12-11 DIAGNOSIS — Z79899 Other long term (current) drug therapy: Secondary | ICD-10-CM | POA: Insufficient documentation

## 2019-12-11 DIAGNOSIS — Z3A08 8 weeks gestation of pregnancy: Secondary | ICD-10-CM | POA: Insufficient documentation

## 2019-12-11 DIAGNOSIS — Z8711 Personal history of peptic ulcer disease: Secondary | ICD-10-CM | POA: Diagnosis not present

## 2019-12-11 DIAGNOSIS — R109 Unspecified abdominal pain: Secondary | ICD-10-CM | POA: Insufficient documentation

## 2019-12-11 DIAGNOSIS — F909 Attention-deficit hyperactivity disorder, unspecified type: Secondary | ICD-10-CM | POA: Diagnosis not present

## 2019-12-11 DIAGNOSIS — O26891 Other specified pregnancy related conditions, first trimester: Secondary | ICD-10-CM | POA: Diagnosis not present

## 2019-12-11 DIAGNOSIS — Z87891 Personal history of nicotine dependence: Secondary | ICD-10-CM | POA: Diagnosis not present

## 2019-12-11 DIAGNOSIS — O99611 Diseases of the digestive system complicating pregnancy, first trimester: Secondary | ICD-10-CM | POA: Diagnosis not present

## 2019-12-11 DIAGNOSIS — Z3A00 Weeks of gestation of pregnancy not specified: Secondary | ICD-10-CM | POA: Diagnosis not present

## 2019-12-11 DIAGNOSIS — O009 Unspecified ectopic pregnancy without intrauterine pregnancy: Secondary | ICD-10-CM | POA: Diagnosis not present

## 2019-12-11 LAB — HCG, QUANTITATIVE, PREGNANCY: hCG, Beta Chain, Quant, S: 228 m[IU]/mL — ABNORMAL HIGH (ref <5)

## 2019-12-11 LAB — HEMOGLOBIN AND HEMATOCRIT, BLOOD
HCT: 38.9 % (ref 36.0–46.0)
Hemoglobin: 13.5 g/dL (ref 12.0–15.0)

## 2019-12-11 LAB — TYPE AND SCREEN
ABO/RH(D): O POS
Antibody Screen: NEGATIVE

## 2019-12-11 NOTE — Discharge Instructions (Signed)
You were seen for follow-up of ectopic pregnancy after administration of methotrexate.   Reassuringly, your beta-hcg showed an appropriate decrease since last check on 7/19.  Your ultrasound did show several cysts but no new concerning findings.  Please follow-up in 7 days for a repeat blood draw to check beta-hcg. We will continue to trend this value weekly until resolved.

## 2019-12-11 NOTE — MAU Provider Note (Signed)
Chief Complaint: Pelvic pain, F/u bHCG for Ectopic Pregnancy    First Provider Initiated Contact with Patient 12/11/19 1712    SUBJECTIVE Joyce Massey  is a 29 y.o. G1P0000  at [redacted]w[redacted]d who presents to MAU today for follow-up quant hCG. The patient reports left lower quadrant pain in addition to light "spotting" over the past several days. Mild nausea but denies vomiting, dysuria and fever. Pt received methotrexate in the MAU on 12/04/19 with repeat bHCG of 320 on 12/07/19, down from 354. 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  Location: [facility] Quality: crampy Severity: 7/10 on pain scale Duration: several days Timing: constant Modifying factors: none Associated signs and symptoms: mild nausea  HPI  Past Medical History:  Diagnosis Date  . ADHD (attention deficit hyperactivity disorder)   . Anxiety   . Endometriosis, mild   . GERD (gastroesophageal reflux disease)   . History of gastric ulcer   . Pelvic pain    Past Surgical History:  Procedure Laterality Date  . LAPAROSCOPIC CHOLECYSTECTOMY  2012  approx.  Marland Kitchen LAPAROSCOPIC OVARIAN CYSTECTOMY Left 07/12/2016   Procedure: OPERATIVE LAPAROSCOPIC OVARIAN CYSTECTOMY AND PERITONEAL BIOPSY;  Surgeon: Eldred Manges, MD;  Location: [place];  Service: Gynecology;  Laterality: Left;   Social History   Socioeconomic History  . Marital status: Married    Spouse name: Not on file  . Number of children: Not on file  . Years of education: Not on file  . Highest education level: Not on file  Occupational History  . Not on file  Tobacco Use  . Smoking status: Former Smoker    Packs/day: 0.50    Years: 5.00    Pack years: 2.50    Types: Cigarettes    Quit date: 07/09/2014    Years since quitting: 5.4  . Smokeless tobacco: Never Used  Substance and Sexual Activity  . Alcohol use: Not Currently     Alcohol/week: 2.0 standard drinks    Types: 2 Standard drinks or equivalent per week    Comment: occasional  . Drug use: No  . Sexual activity: Yes    Partners: Male    Birth control/protection: None  Other Topics Concern  . Not on file  Social History Narrative  . Not on file   Social Determinants of Health   Financial Resource Strain:   . Difficulty of Paying Living Expenses:   Food Insecurity:   . Worried About Charity fundraiser in the Last Year:   . Arboriculturist in the Last Year:   Transportation Needs:   . Film/video editor (Medical):   Marland Kitchen Lack of Transportation (Non-Medical):   Physical Activity:   . Days of Exercise per Week:   . Minutes of Exercise per Session:   Stress:   . Feeling of Stress :   Social Connections:   . Frequency of Communication with Friends and Family:   . Frequency of Social Gatherings with Friends and Family:   . Attends Religious Services:   . Active Member of Clubs or Organizations:   . Attends Archivist Meetings:   Marland Kitchen Marital Status:   Intimate Partner Violence:   . Fear of Current or Ex-Partner:   . Emotionally Abused:   Marland Kitchen Physically Abused:   . Sexually Abused:    No current facility-administered medications  on file prior to encounter.   Current Outpatient Medications on File Prior to Encounter  Medication Sig Dispense Refill  . amphetamine-dextroamphetamine (ADDERALL) 20 MG tablet Take 1 tablet by mouth 2 (two) times daily. Am and then 4-6 hours later  0  . benzonatate (TESSALON) 100 MG capsule Take 1 capsule (100 mg total) by mouth every 8 (eight) hours. 21 capsule 0  . Crisaborole (EUCRISA) 2 % OINT Apply topically 2 (two) times daily as needed.     . cyclobenzaprine (FLEXERIL) 10 MG tablet Take 1 tablet (10 mg total) by mouth 3 (three) times daily as needed for muscle spasms. 30 tablet 0  . fluticasone (FLONASE) 50 MCG/ACT nasal spray Place 1 spray into both nostrils daily.    Marland Kitchen HYDROcodone-acetaminophen  (NORCO/VICODIN) 5-325 MG tablet Take 1 tablet by mouth every 4 (four) hours as needed for moderate pain. 20 tablet 0  . loratadine (CLARITIN) 10 MG tablet Take 10 mg by mouth every morning.     . Multiple Vitamins-Calcium (ONE-A-DAY WOMENS FORMULA PO) Take 1 tablet by mouth daily.    . ondansetron (ZOFRAN) 8 MG tablet Take 1 tablet (8 mg total) by mouth every 8 (eight) hours as needed for nausea or vomiting. 30 tablet 0  . Prenatal Vit-Fe Fumarate-FA (PRENATAL MULTIVITAMIN) TABS tablet Take 1 tablet by mouth daily at 12 noon.    . ranitidine (ZANTAC) 150 MG tablet Take 150 mg by mouth as needed for heartburn.    . sennosides-docusate sodium (SENOKOT-S) 8.6-50 MG tablet Take 2 tablets by mouth daily. 60 tablet 0   Allergies  Allergen Reactions  . Latex Rash  . Tape Rash    Some tapes cause rashes (adhesives may be the reason)    ROS:  Review of Systems  Constitutional: Negative for chills and fever.  Respiratory: Negative for shortness of breath.   Cardiovascular: Negative for chest pain.  Gastrointestinal: Positive for abdominal pain and nausea. Negative for vomiting.  Genitourinary: Negative for dysuria, vaginal bleeding, vaginal discharge and vaginal pain.  Musculoskeletal: Positive for back pain.  Neurological: Negative for headaches.   I have reviewed patient's Past Medical Hx, Surgical Hx, Family Hx, Social Hx, medications and allergies.   Physical Exam   Patient Vitals for the past 24 hrs:  BP Temp Temp src Pulse Resp SpO2 Height Weight  12/11/19 1652 (!) 128/94 98.4 F (36.9 C) Oral 99 22 100 % 5\' 7"  (1.702 m) 72.8 kg   Constitutional: Well-developed, well-nourished female in no acute distress.  Cardiovascular: normal rate, regular rhythm Respiratory: normal effort GI: Soft, non-distended, moderate tenderness in left lower quadrant without rebound or guarding. Pos BS x 4 MS: Extremities nontender, no edema, normal ROM Neurologic: Alert and oriented x 4.  Skin: no  visible lesions Extremities: warm and well-perfused GU: deferred  PELVIC EXAM: deferred Bimanual exam: deferred  LAB RESULTS Results for orders placed or performed during the hospital encounter of 12/11/19 (from the past 24 hour(s))  hCG, quantitative, pregnancy     Status: Abnormal   Collection Time: 12/11/19  5:04 PM  Result Value Ref Range   hCG, Beta Chain, Quant, S 228 (H) <5 mIU/mL  Hemoglobin and hematocrit, blood     Status: None   Collection Time: 12/11/19  5:11 PM  Result Value Ref Range   Hemoglobin 13.5 12.0 - 15.0 g/dL   HCT 38.9 36 - 46 %  Type and screen     Status: None   Collection Time: 12/11/19  5:30 PM  Result Value Ref Range   ABO/RH(D) O POS    Antibody Screen NEG    Sample Expiration      12/14/2019,2359 Performed at [facility], [place] [address], [place], [place] [address]     --/--/O POS (07/23 1730)  IMAGING US OB Transvaginal  Result Date: 12/11/2019 CLINICAL DATA:  Abdominal pain EXAM: TRANSVAGINAL OB ULTRASOUND TECHNIQUE: Transvaginal ultrasound was performed for complete evaluation of the gestation as well as the maternal uterus, adnexal regions, and pelvic cul-de-sac. COMPARISON:  12/04/2019 FINDINGS: The cystic lesion identified within the right uterine fundus within the cornual region is unchanged in size measuring 3-4 mm. This is indeterminate. Its stability since prior examination would argue against an intrauterine gestational sac and this may simply represent an endometrial cyst. No definite intrauterine gestational sac is identified. The uterus is otherwise unremarkable. No intrauterine masses are seen. The cervix is closed and is unremarkable. The endometrial stripe is otherwise uniform measuring 7 mm in thickness. Subchorionic hemorrhage:  Not applicable Maternal adnexa: A left ovarian cyst has developed measuring 3.0 x 3.1 x 2.9 cm, possibly representing a follicular cyst. The ovaries are otherwise normal in size and echogenicity.  Multiple follicles are again seen within the right ovary. There is no free fluid seen within the cul-de-sac. IMPRESSION: Stable cystic lesion within the right fundal endometrium, indeterminate. This likely represents, simply, an endometrial cyst given its stability since prior examination. No definite intrauterine gestational sac identified. No free fluid within the cul-de-sac. No adnexal masses. Electronically Signed   By: Fidela Salisbury MD   On: 12/11/2019 18:22   US OB Transvaginal  Result Date: 12/04/2019 CLINICAL DATA:  Pregnancy of unknown location, abnormal beta HCG levels, beta HCG 280 yesterday, 94 11/26/2019 EXAM: TRANSVAGINAL OB ULTRASOUND TECHNIQUE: Transvaginal ultrasound was performed for complete evaluation of the gestation as well as the maternal uterus, adnexal regions, and pelvic cul-de-sac. COMPARISON:  11/23/2019 FINDINGS: Intrauterine gestational sac: Single Yolk sac:  Not Visualized. Embryo:  Not Visualized. Cardiac Activity: Not Visualized. MSD: 4.3 mm   5 w   1 d Subchorionic hemorrhage:  None visualized. Maternal uterus/adnexae: Right ovary measures 2.1 x 2.2 x 3.1 cm and the left ovary measures 1.3 x 2.2 x 1.4 cm. There are no adnexal masses. No free fluid. IMPRESSION: 1. Possible early intrauterine gestational sac, but no yolk sac, fetal pole, or cardiac activity yet visualized. Recommend follow-up quantitative B-HCG levels and follow-up US in 14 days to assess viability. This recommendation follows SRU consensus guidelines: Diagnostic Criteria for Nonviable Pregnancy Early in the First Trimester. [employer] Med 2013; 017:5102-58 2. No adnexal masses or sonographic evidence of ectopic pregnancy. . Electronically Signed   By: Randa Ngo M.D.   On: 12/04/2019 17:43   US OB LESS THAN 14 WEEKS WITH OB TRANSVAGINAL  Result Date: 11/23/2019 CLINICAL DATA:  Initial evaluation for acute vaginal bleeding, early pregnancy. EXAM: OBSTETRIC <14 WK [place] AND TRANSVAGINAL OB US TECHNIQUE: Both  transabdominal and transvaginal ultrasound examinations were performed for complete evaluation of the gestation as well as the maternal uterus, adnexal regions, and pelvic cul-de-sac. Transvaginal technique was performed to assess early pregnancy. COMPARISON:  None. FINDINGS: Intrauterine gestational sac: Negative. Yolk sac:  Negative. Embryo:  Negative. Cardiac Activity: Negative. Subchorionic hemorrhage:  None visualized. Maternal uterus/adnexae: Right ovary within normal limits. Left ovary not visualized. No adnexal mass or free fluid. IMPRESSION: 1. Early pregnancy with no discrete IUP or adnexal mass identified. Finding is consistent with a pregnancy  of unknown anatomic location. Differential considerations include IUP to early to visualize, recent SAB, or possibly occult ectopic pregnancy. Close clinical monitoring with serial beta HCGs and close interval follow-up ultrasound recommended as clinically warranted. 2. No other acute maternal uterine or adnexal abnormality identified. Electronically Signed   By: Jeannine Boga M.D.   On: 11/23/2019 23:01    MAU Management/MDM: Orders Placed This Encounter  Procedures  . US OB Transvaginal  . Hemoglobin and hematocrit, blood  . Diet NPO time specified  . Type and screen  . Discharge patient    No orders of the defined types were placed in this encounter.   Treatments in MAU included review of labs and ultrasound. Pt discharged with strict return precautions.  ASSESSMENT 1. Ectopic pregnancy without intrauterine pregnancy, unspecified location. Given report of worsening abdominal pain, repeat ultrasound was obtained in MAU. Several cysts (likely physiologic) were noted but no evidence of rupture.  2. Abdominal pain during pregnancy in first trimester. Most likely secondary to recent administration of methotrexate for ectopic pregnancy on 12/04/19. Provided return precautions for worsening symptoms.  3. Left ovarian cyst. Visible on repeat  ultrasound today. No evidence of rupture and known history of endometriosis. Strict return precautions for worsening abdominal pain, tachycardia or other concerning symptoms.   PLAN Discharge home with plan for follow-up of repeat beta-hcg in 1 week (12/18/19) at [facility]. Bleeding/worsening pain precautions discussed Patient may return to MAU as needed or if her condition were to change or worsen   Allergies as of 12/11/2019      Reactions   Latex Rash   Tape Rash   Some tapes cause rashes (adhesives may be the reason)      Medication List    STOP taking these medications   ONE-A-DAY WOMENS FORMULA PO   prenatal multivitamin Tabs tablet     TAKE these medications   amphetamine-dextroamphetamine 20 MG tablet Commonly known as: ADDERALL Take 1 tablet by mouth 2 (two) times daily. Am and then 4-6 hours later   benzonatate 100 MG capsule Commonly known as: TESSALON Take 1 capsule (100 mg total) by mouth every 8 (eight) hours.   cyclobenzaprine 10 MG tablet Commonly known as: FLEXERIL Take 1 tablet (10 mg total) by mouth 3 (three) times daily as needed for muscle spasms.   Eucrisa 2 % Oint Generic drug: Crisaborole Apply topically 2 (two) times daily as needed.   fluticasone 50 MCG/ACT nasal spray Commonly known as: FLONASE Place 1 spray into both nostrils daily.   HYDROcodone-acetaminophen 5-325 MG tablet Commonly known as: NORCO/VICODIN Take 1 tablet by mouth every 4 (four) hours as needed for moderate pain.   loratadine 10 MG tablet Commonly known as: CLARITIN Take 10 mg by mouth every morning.   ondansetron 8 MG tablet Commonly known as: ZOFRAN Take 1 tablet (8 mg total) by mouth every 8 (eight) hours as needed for nausea or vomiting.   ranitidine 150 MG tablet Commonly known as: ZANTAC Take 150 mg by mouth as needed for heartburn.   sennosides-docusate sodium 8.6-50 MG tablet Commonly known as: SENOKOT-S Take 2 tablets by mouth daily.        Randa Ngo, MD  12/11/2019 6:58 PM

## 2019-12-11 NOTE — MAU Note (Signed)
Increased pain today, back has really been hurting, pain in [facility].  Was unable to take Flexeril while at work. Having light brownish bleeding.  Feels really bloated.

## 2019-12-14 MED FILL — DEXTROAMP-AMPHETAMIN 20 MG: 20 | 30 days supply | Qty: 30 | Fill #0

## 2019-12-16 ENCOUNTER — Other Ambulatory Visit: Payer: Self-pay

## 2019-12-16 ENCOUNTER — Other Ambulatory Visit: Payer: 59

## 2019-12-16 DIAGNOSIS — O26891 Other specified pregnancy related conditions, first trimester: Secondary | ICD-10-CM | POA: Diagnosis not present

## 2019-12-16 DIAGNOSIS — R109 Unspecified abdominal pain: Secondary | ICD-10-CM | POA: Diagnosis not present

## 2019-12-17 LAB — BETA HCG QUANT (REF LAB): hCG Quant: 41 m[IU]/mL

## 2019-12-18 ENCOUNTER — Other Ambulatory Visit: Payer: 59

## 2019-12-23 ENCOUNTER — Other Ambulatory Visit: Payer: 59

## 2019-12-23 ENCOUNTER — Other Ambulatory Visit: Payer: Self-pay

## 2019-12-23 DIAGNOSIS — O009 Unspecified ectopic pregnancy without intrauterine pregnancy: Secondary | ICD-10-CM | POA: Diagnosis not present

## 2019-12-24 LAB — BETA HCG QUANT (REF LAB): hCG Quant: 1 m[IU]/mL

## 2019-12-28 MED FILL — ADDERALL XR 30 MG CAP SA: 30 | 30 days supply | Qty: 30 | Fill #0

## 2020-01-04 MED FILL — valACYclovir HCL 1 GM TABS: 1 | 7 days supply | Qty: 30 | Fill #3

## 2020-01-12 MED FILL — DEXTROAMP-AMPHETAMIN 20 MG: 20 | 30 days supply | Qty: 30 | Fill #0

## 2020-01-26 MED FILL — ADDERALL XR 30 MG CAP SA: 30 | 30 days supply | Qty: 30 | Fill #0

## 2020-02-09 DIAGNOSIS — F902 Attention-deficit hyperactivity disorder, combined type: Secondary | ICD-10-CM | POA: Diagnosis not present

## 2020-02-10 MED FILL — DEXTROAMP-AMPHETAMIN 20 MG: 20 | 30 days supply | Qty: 30 | Fill #0

## 2020-02-24 MED FILL — ADDERALL XR 30 MG CAP SA: 30 | 30 days supply | Qty: 30 | Fill #0

## 2020-03-10 MED FILL — DEXTROAMP-AMPHETAMIN 20 MG: 20 | 30 days supply | Qty: 30 | Fill #0

## 2020-03-18 ENCOUNTER — Other Ambulatory Visit (HOSPITAL_COMMUNITY): Payer: Self-pay | Admitting: Obstetrics & Gynecology

## 2020-03-18 DIAGNOSIS — Z01419 Encounter for gynecological examination (general) (routine) without abnormal findings: Secondary | ICD-10-CM | POA: Diagnosis not present

## 2020-03-18 DIAGNOSIS — Z6825 Body mass index (BMI) 25.0-25.9, adult: Secondary | ICD-10-CM | POA: Diagnosis not present

## 2020-03-18 MED FILL — CYCLOBENZAPRINE HCL 5 MG TA: 5 | 10 days supply | Qty: 30 | Fill #0

## 2020-03-18 MED FILL — VALACYCLOVIR HCL 500 MG TAB: 500 | 15 days supply | Qty: 30 | Fill #0

## 2020-03-23 MED FILL — ADDERALL XR 30 MG CAP SA: 30 | 30 days supply | Qty: 30 | Fill #0

## 2020-04-07 MED FILL — DEXTROAMP-AMPHETAMIN 20 MG: 20 | 30 days supply | Qty: 30 | Fill #0

## 2020-04-21 MED FILL — ADDERALL XR 30 MG CAP SA: 30 | 30 days supply | Qty: 30 | Fill #0

## 2020-04-26 DIAGNOSIS — Z3202 Encounter for pregnancy test, result negative: Secondary | ICD-10-CM | POA: Diagnosis not present

## 2020-04-26 DIAGNOSIS — Z3141 Encounter for fertility testing: Secondary | ICD-10-CM | POA: Diagnosis not present

## 2020-05-03 ENCOUNTER — Other Ambulatory Visit (HOSPITAL_COMMUNITY): Payer: Self-pay | Admitting: Psychiatry

## 2020-05-03 DIAGNOSIS — F902 Attention-deficit hyperactivity disorder, combined type: Secondary | ICD-10-CM | POA: Diagnosis not present

## 2020-05-05 MED FILL — DEXTROAMP-AMPHETAMIN 20 MG: 20 | 30 days supply | Qty: 30 | Fill #0

## 2020-05-19 MED FILL — ADDERALL XR 30 MG CAP SA: 30 | 30 days supply | Qty: 30 | Fill #0

## 2020-06-02 ENCOUNTER — Other Ambulatory Visit (HOSPITAL_COMMUNITY): Payer: Self-pay | Admitting: Obstetrics & Gynecology

## 2020-06-02 MED FILL — VALACYCLOVIR HCL 500 MG TAB: 500 | 15 days supply | Qty: 30 | Fill #0

## 2020-06-02 MED FILL — DEXTROAMP-AMPHETAMIN 20 MG: 20 | 30 days supply | Qty: 30 | Fill #0

## 2020-07-06 ENCOUNTER — Other Ambulatory Visit (HOSPITAL_COMMUNITY): Payer: Self-pay | Admitting: Obstetrics & Gynecology

## 2020-07-06 DIAGNOSIS — N911 Secondary amenorrhea: Secondary | ICD-10-CM | POA: Diagnosis not present

## 2020-07-06 MED FILL — DOXYLAMINE-PYRIDOXINE 10-10: 10-10 | 30 days supply | Qty: 60 | Fill #0

## 2020-07-13 DIAGNOSIS — Z3481 Encounter for supervision of other normal pregnancy, first trimester: Secondary | ICD-10-CM | POA: Diagnosis not present

## 2020-07-13 DIAGNOSIS — Z31430 Encounter of female for testing for genetic disease carrier status for procreative management: Secondary | ICD-10-CM | POA: Diagnosis not present

## 2020-07-13 DIAGNOSIS — Z3685 Encounter for antenatal screening for Streptococcus B: Secondary | ICD-10-CM | POA: Diagnosis not present

## 2020-07-25 DIAGNOSIS — Z3400 Encounter for supervision of normal first pregnancy, unspecified trimester: Secondary | ICD-10-CM | POA: Diagnosis not present

## 2020-07-25 DIAGNOSIS — Z113 Encounter for screening for infections with a predominantly sexual mode of transmission: Secondary | ICD-10-CM | POA: Diagnosis not present

## 2020-08-10 DIAGNOSIS — Z3A12 12 weeks gestation of pregnancy: Secondary | ICD-10-CM | POA: Diagnosis not present

## 2020-08-10 DIAGNOSIS — Z3682 Encounter for antenatal screening for nuchal translucency: Secondary | ICD-10-CM | POA: Diagnosis not present

## 2020-09-08 IMAGING — US US OB < 14 WEEKS - US OB TV
1 series · 15 of 28 positions shown · non-contrast
Comparison: None.

CLINICAL DATA: Initial evaluation for acute vaginal bleeding, early
pregnancy.

EXAM:
OBSTETRIC <14 WK US AND TRANSVAGINAL OB US
TECHNIQUE: Both transabdominal and transvaginal ultrasound examinations were
performed for complete evaluation of the gestation as well as the
maternal uterus, adnexal regions, and pelvic cul-de-sac.
Transvaginal technique was performed to assess early pregnancy.

[Series 1: us ob < 14 weeks - us ob tv · 15 of 47 slices shown]
[im 1/47]
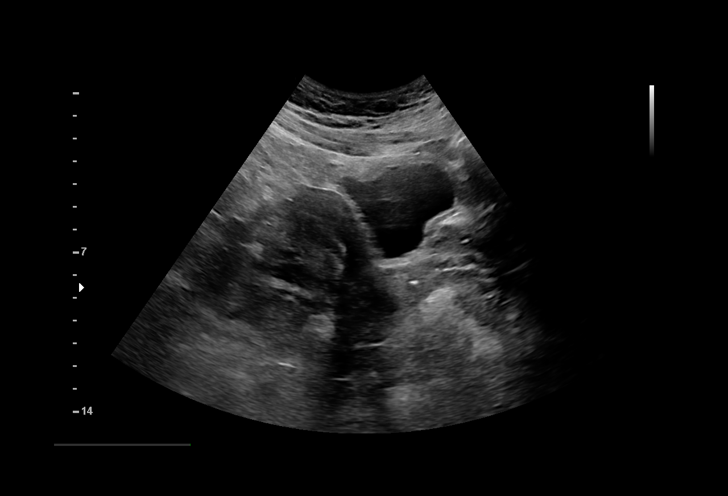
[im 4/47]
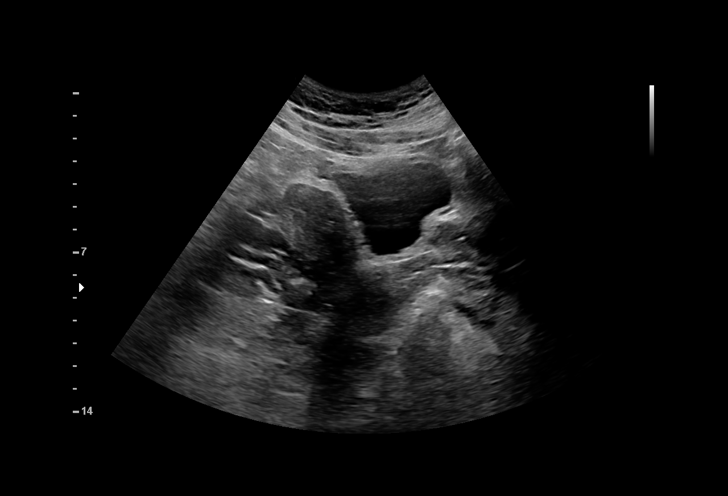
[im 7/47]
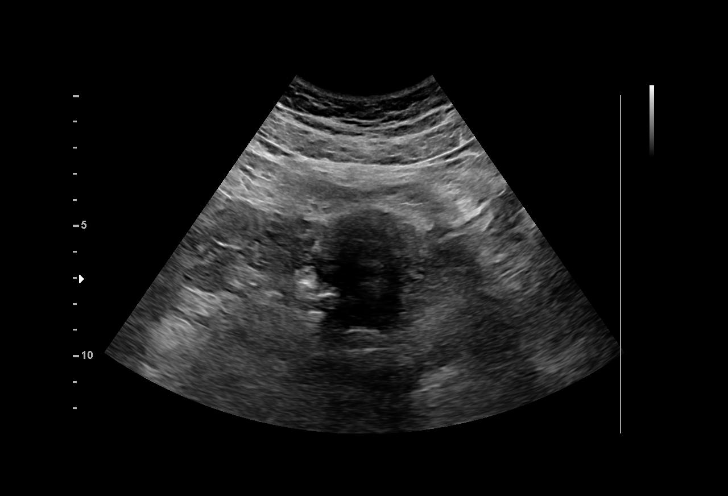
[im 11/47]
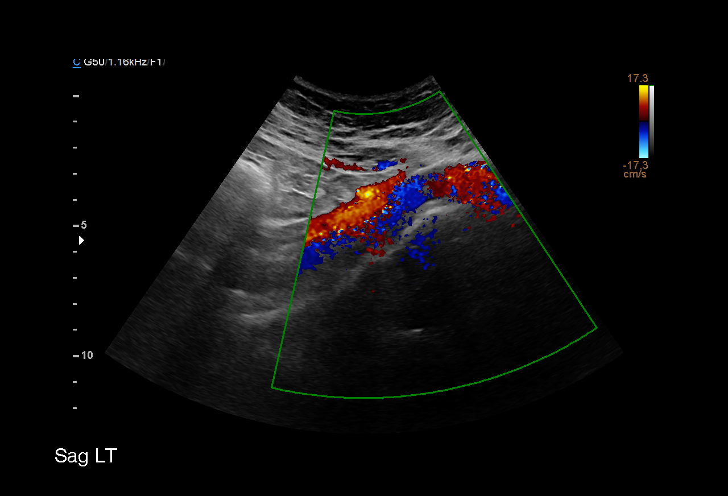
[im 14/47]
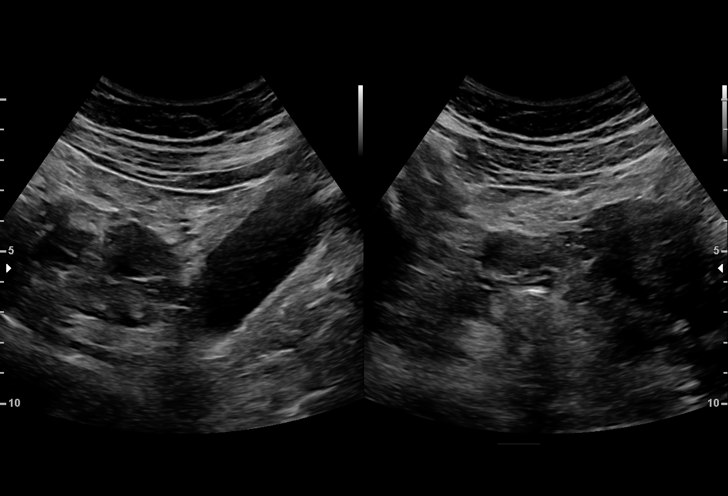
[im 18/47]
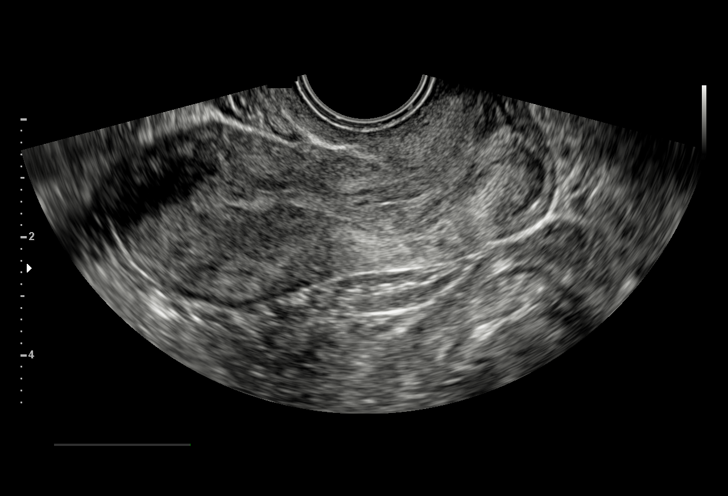
[im 21/47]
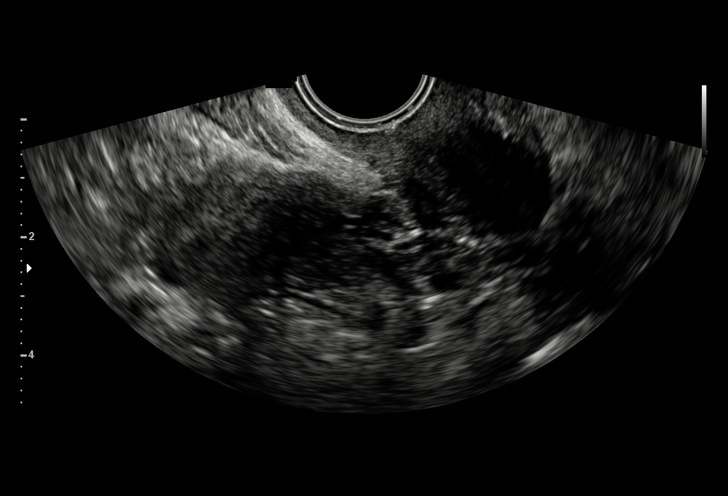
[im 24/47]
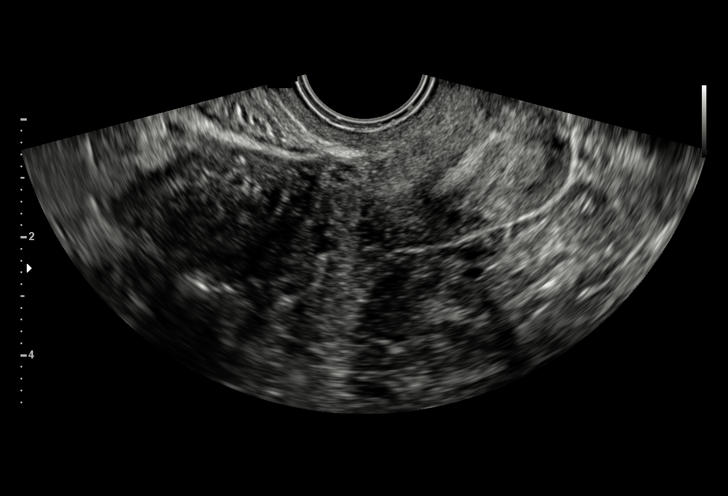
[im 26/47]
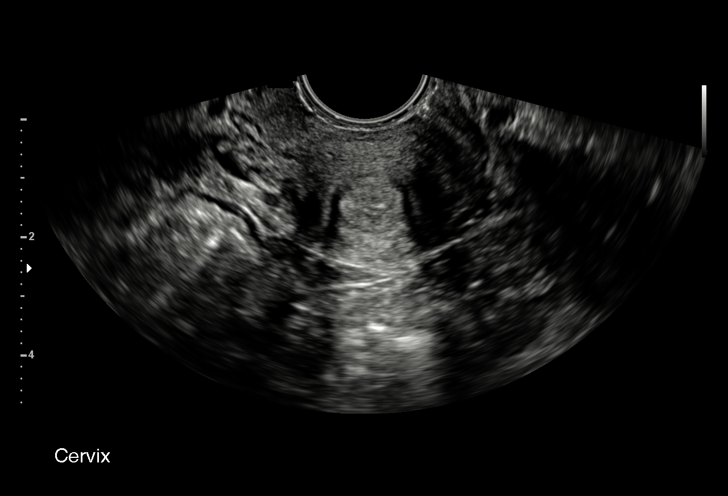
[im 29/47]
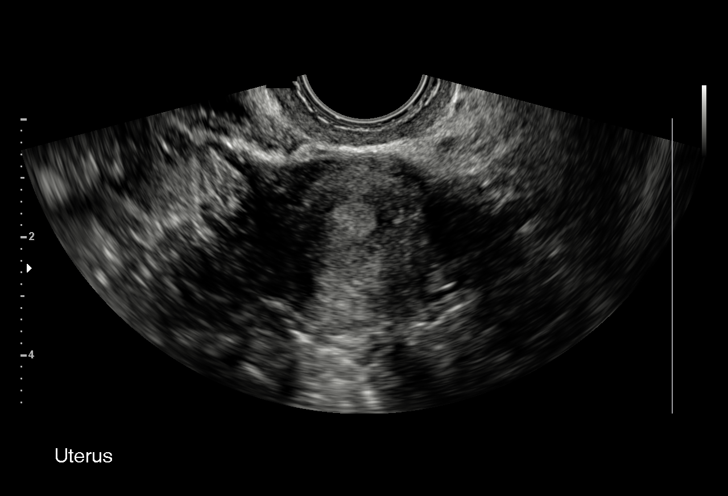
[im 33/47]
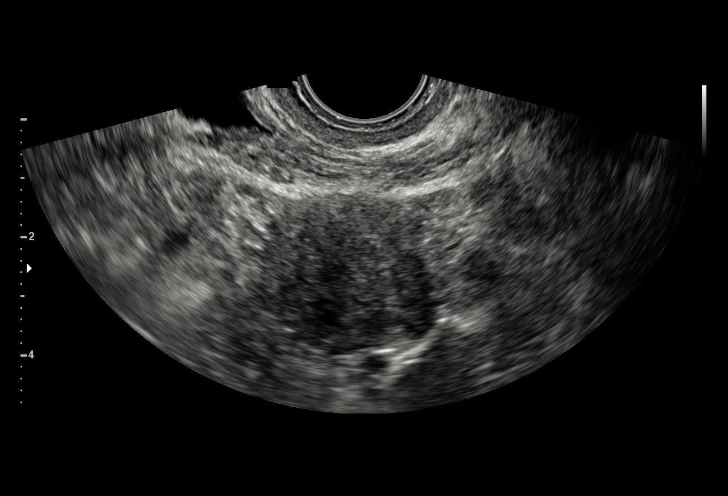
[im 36/47]
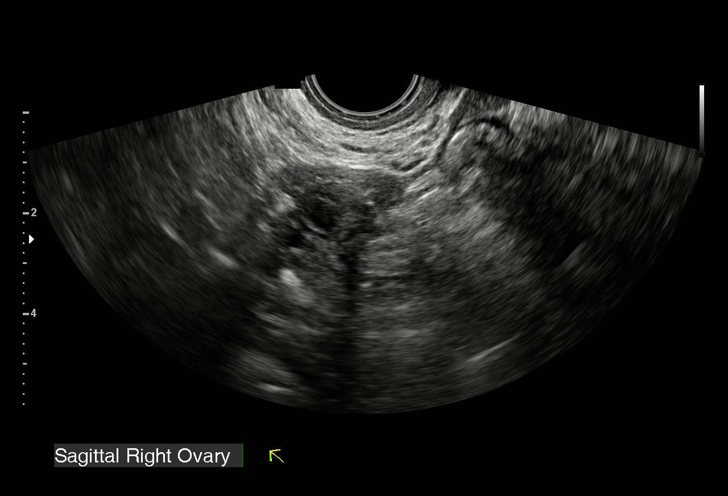
[im 40/47]
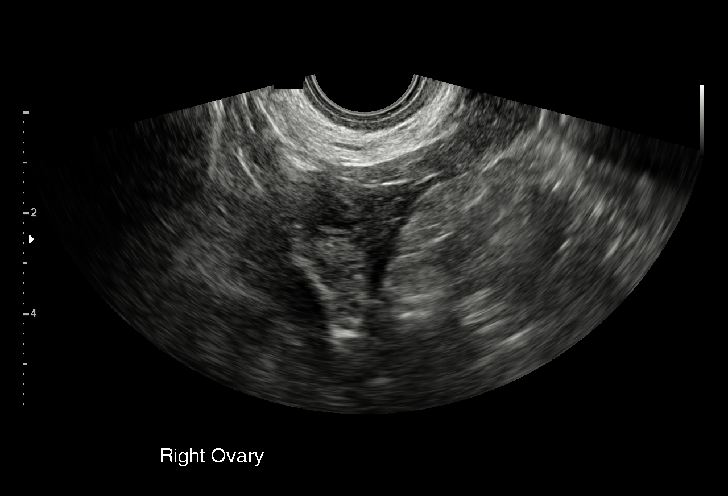
[im 43/47]
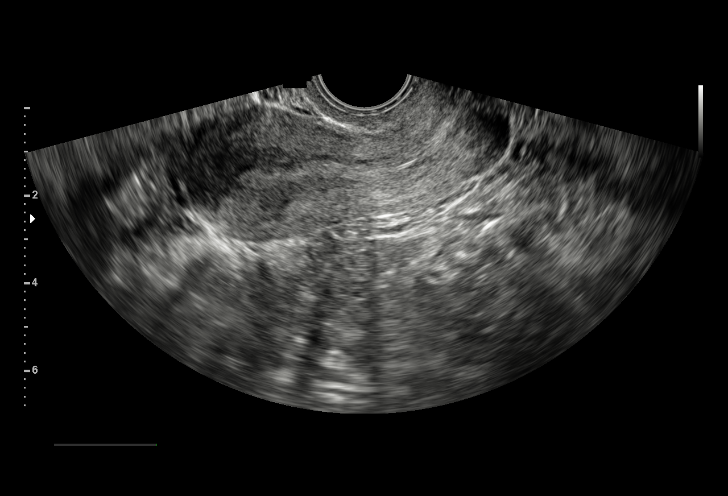
[im 47/47]
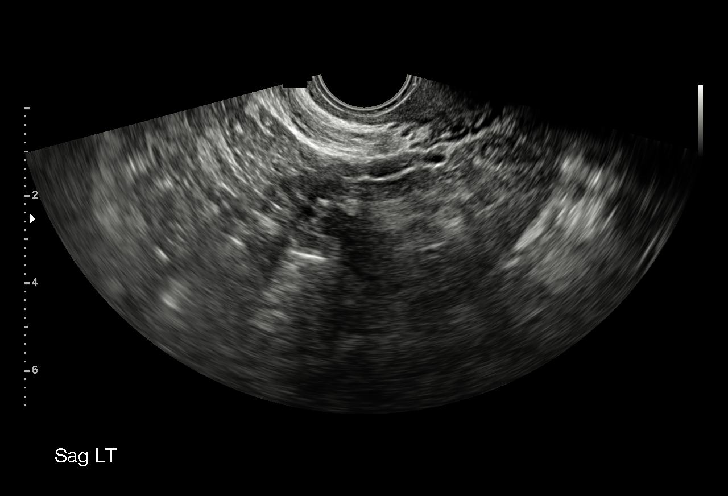

[15 of 28 positions shown; findings below may reference images not displayed]

FINDINGS: Intrauterine gestational sac: Negative.

Yolk sac:  Negative.

Embryo:  Negative.

Cardiac Activity: Negative.

Subchorionic hemorrhage:  None visualized.

Maternal uterus/adnexae: Right ovary within normal limits. Left
ovary not visualized. No adnexal mass or free fluid.
IMPRESSION: 1. Early pregnancy with no discrete IUP or adnexal mass identified.
Finding is consistent with a pregnancy of unknown anatomic location.
Differential considerations include IUP to early to visualize,
recent SAB, or possibly occult ectopic pregnancy. Close clinical
monitoring with serial beta HCGs and close interval follow-up
ultrasound recommended as clinically warranted.
2. No other acute maternal uterine or adnexal abnormality
identified.

## 2020-09-23 DIAGNOSIS — Z3400 Encounter for supervision of normal first pregnancy, unspecified trimester: Secondary | ICD-10-CM | POA: Diagnosis not present

## 2020-09-23 DIAGNOSIS — Z363 Encounter for antenatal screening for malformations: Secondary | ICD-10-CM | POA: Diagnosis not present

## 2020-09-23 DIAGNOSIS — Z3A18 18 weeks gestation of pregnancy: Secondary | ICD-10-CM | POA: Diagnosis not present

## 2020-09-26 IMAGING — US US OB TRANSVAGINAL
1 series · 15 of 28 positions shown · non-contrast
Comparison: 12/04/2019

CLINICAL DATA: Abdominal pain

EXAM:
TRANSVAGINAL OB ULTRASOUND
TECHNIQUE: Transvaginal ultrasound was performed for complete evaluation of the
gestation as well as the maternal uterus, adnexal regions, and
pelvic cul-de-sac.

[Series 1: us ob transvaginal · 48 acquisitions, 15 frames shown]
[im 1/48]
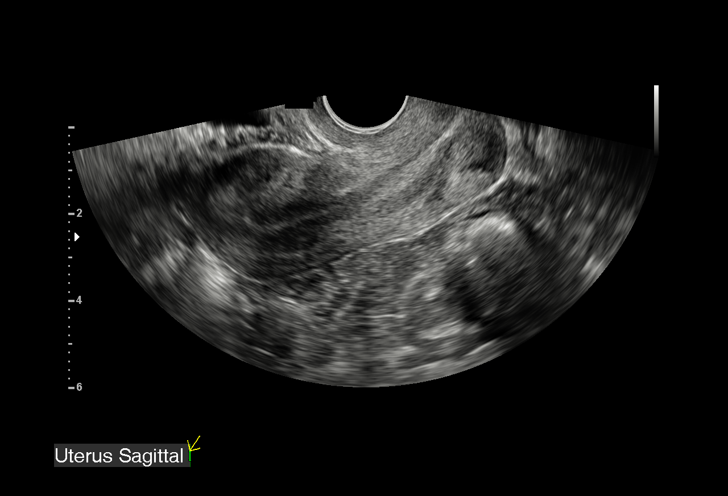
[im 4/48]
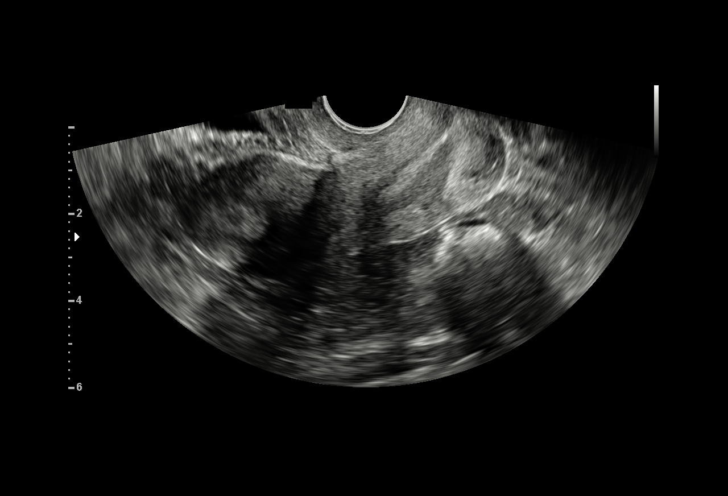
[im 7/48]
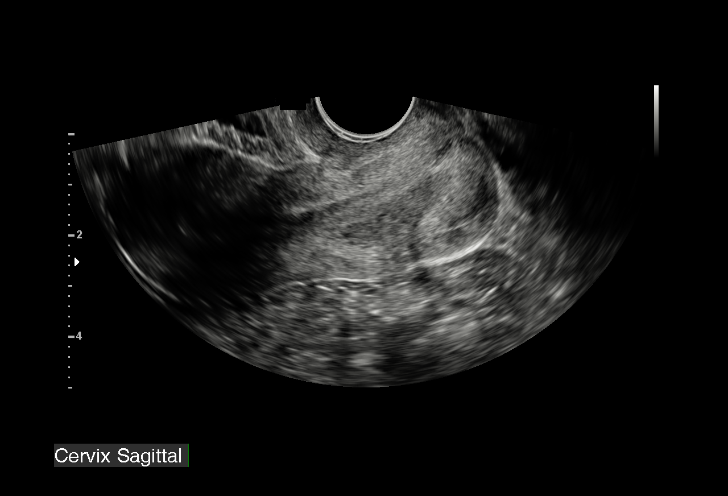
[im 11/48]
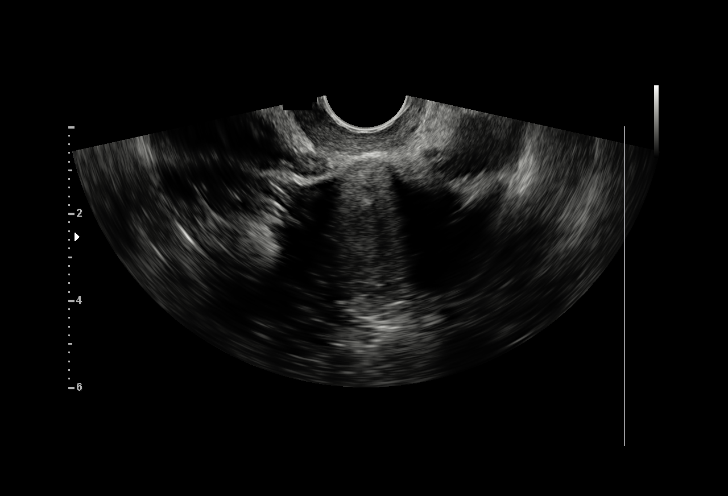
[im 14/48]
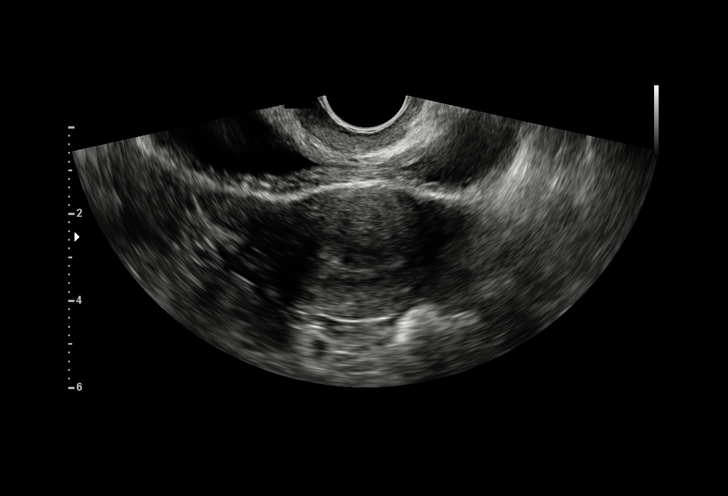
[im 18/48]
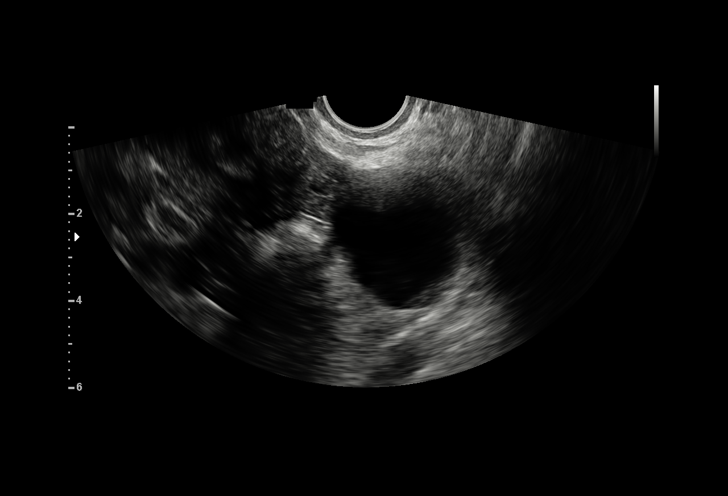
[im 21/48]
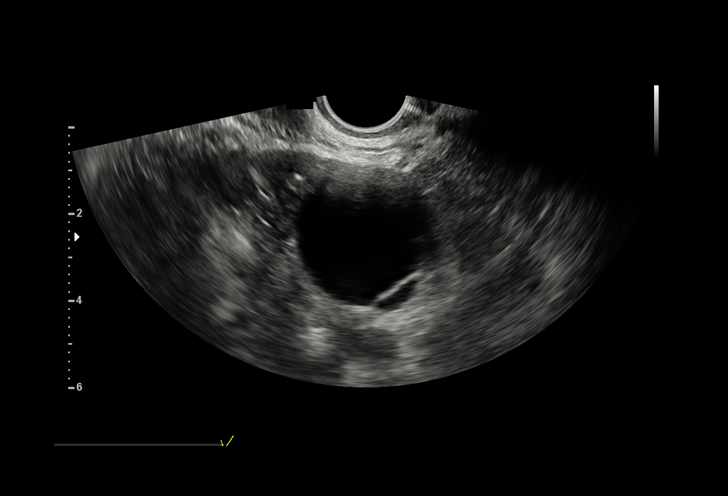
[im 25/48]
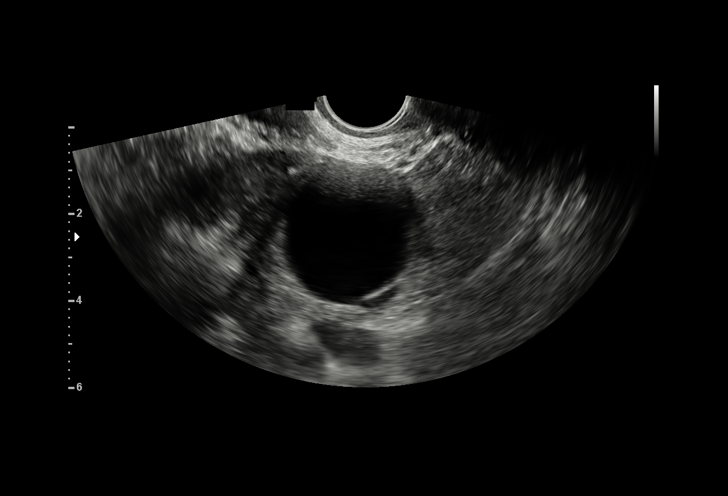
[im 27/48]
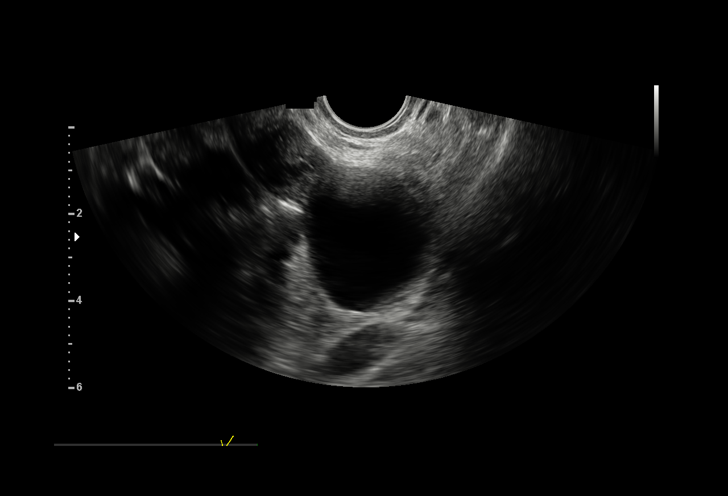
[im 30/48]
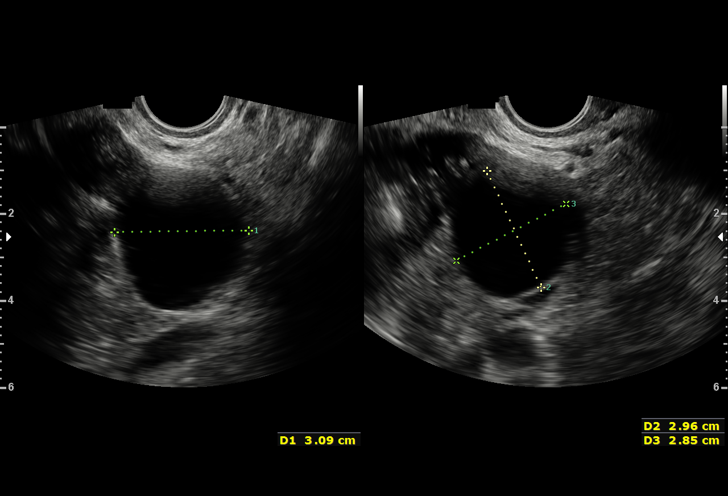
[im 34/48]
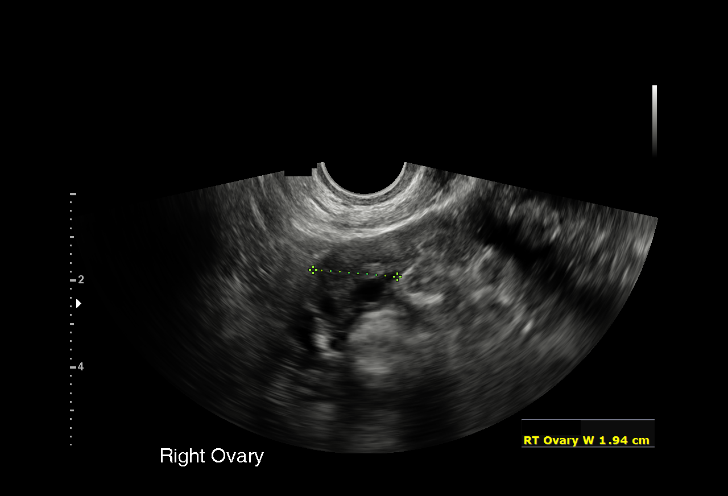
[im 37/48]
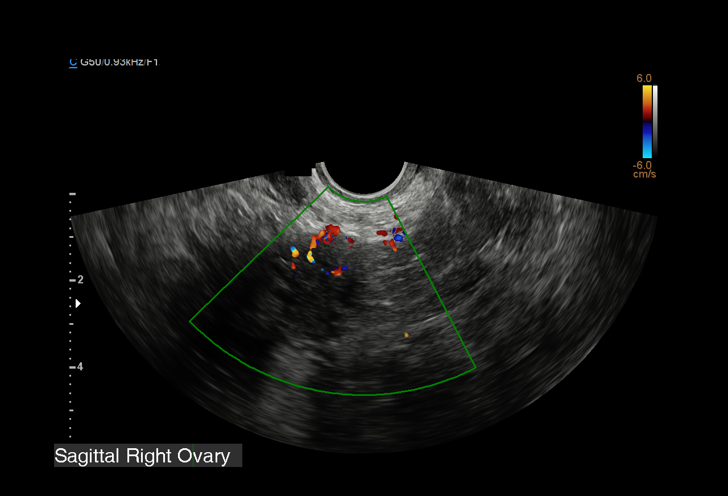
[im 41/48]
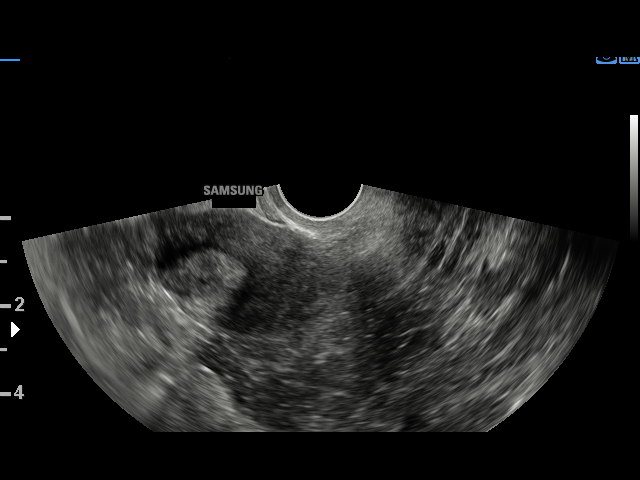
[im 44/48]
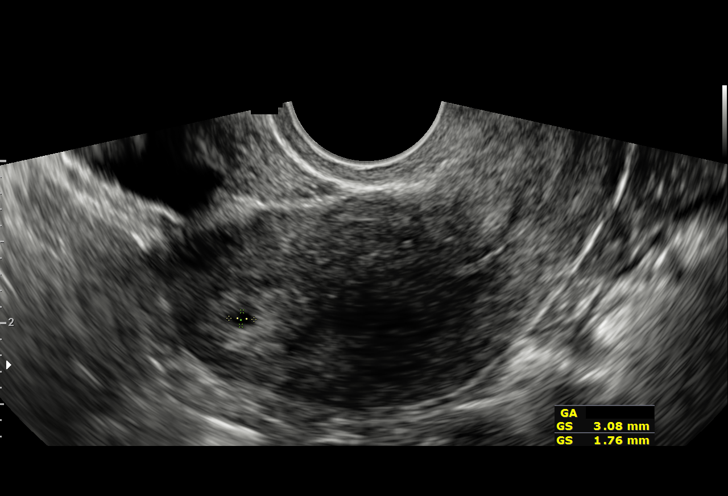
[im 48/48]
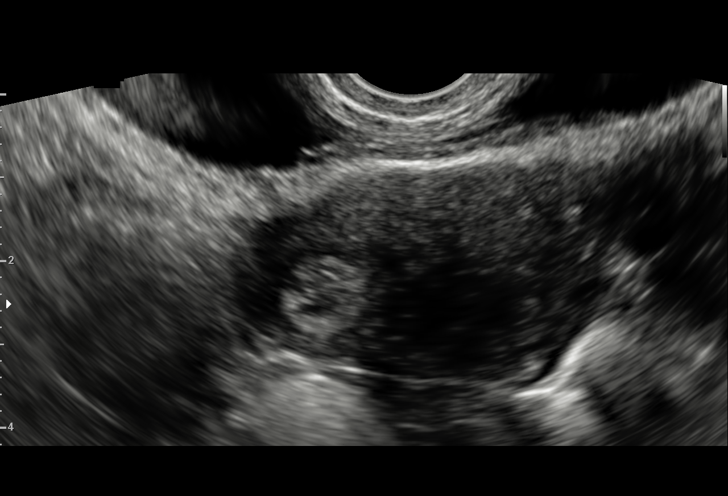

[15 of 28 positions shown; findings below may reference images not displayed]

FINDINGS: The cystic lesion identified within the right uterine fundus within
the cornual region is unchanged in size measuring 3-4 mm. This is
indeterminate. Its stability since prior examination would argue
against an intrauterine gestational sac and this may simply
represent an endometrial cyst. No definite intrauterine gestational
sac is identified.

The uterus is otherwise unremarkable. No intrauterine masses are
seen. The cervix is closed and is unremarkable. The endometrial
stripe is otherwise uniform measuring 7 mm in thickness.

Subchorionic hemorrhage:  Not applicable

Maternal adnexa: A left ovarian cyst has developed measuring 3.0 x
3.1 x 2.9 cm, possibly representing a follicular cyst. The ovaries
are otherwise normal in size and echogenicity. Multiple follicles
are again seen within the right ovary.

There is no free fluid seen within the cul-de-sac.
IMPRESSION: Stable cystic lesion within the right fundal endometrium,
indeterminate. This likely represents, simply, an endometrial cyst
given its stability since prior examination. No definite
intrauterine gestational sac identified. No free fluid within the
cul-de-sac. No adnexal masses.

## 2020-10-24 DIAGNOSIS — Z3400 Encounter for supervision of normal first pregnancy, unspecified trimester: Secondary | ICD-10-CM | POA: Diagnosis not present

## 2020-10-24 DIAGNOSIS — Z3A23 23 weeks gestation of pregnancy: Secondary | ICD-10-CM | POA: Diagnosis not present

## 2020-10-24 DIAGNOSIS — Z363 Encounter for antenatal screening for malformations: Secondary | ICD-10-CM | POA: Diagnosis not present

## 2020-11-19 ENCOUNTER — Other Ambulatory Visit (HOSPITAL_COMMUNITY): Payer: Self-pay

## 2020-11-19 MED FILL — Valacyclovir HCl Tab 500 MG: ORAL | 15 days supply | Qty: 30 | Fill #0 | Status: AC

## 2020-11-19 MED FILL — Valacyclovir HCl Tab 500 MG: ORAL | Qty: 30 | Fill #0 | Status: CN

## 2020-11-23 DIAGNOSIS — Z23 Encounter for immunization: Secondary | ICD-10-CM | POA: Diagnosis not present

## 2020-11-23 DIAGNOSIS — Z3480 Encounter for supervision of other normal pregnancy, unspecified trimester: Secondary | ICD-10-CM | POA: Diagnosis not present

## 2020-11-23 DIAGNOSIS — Z3400 Encounter for supervision of normal first pregnancy, unspecified trimester: Secondary | ICD-10-CM | POA: Diagnosis not present

## 2020-11-28 DIAGNOSIS — O99810 Abnormal glucose complicating pregnancy: Secondary | ICD-10-CM | POA: Diagnosis not present

## 2021-01-02 DIAGNOSIS — R030 Elevated blood-pressure reading, without diagnosis of hypertension: Secondary | ICD-10-CM | POA: Diagnosis not present

## 2021-01-26 ENCOUNTER — Encounter (HOSPITAL_COMMUNITY): Payer: Self-pay | Admitting: Obstetrics and Gynecology

## 2021-01-26 ENCOUNTER — Inpatient Hospital Stay (EMERGENCY_DEPARTMENT_HOSPITAL)
Admission: AD | Admit: 2021-01-26 | Discharge: 2021-01-26 | Disposition: A | Discharge status: Home / Self Care | Payer: 59 | Source: Home / Self Care | Attending: Obstetrics and Gynecology | Admitting: Obstetrics and Gynecology

## 2021-01-26 DIAGNOSIS — R0602 Shortness of breath: Secondary | ICD-10-CM | POA: Diagnosis not present

## 2021-01-26 DIAGNOSIS — O1414 Severe pre-eclampsia complicating childbirth: Secondary | ICD-10-CM | POA: Diagnosis not present

## 2021-01-26 DIAGNOSIS — Z3A37 37 weeks gestation of pregnancy: Secondary | ICD-10-CM | POA: Diagnosis not present

## 2021-01-26 DIAGNOSIS — Z3685 Encounter for antenatal screening for Streptococcus B: Secondary | ICD-10-CM | POA: Diagnosis not present

## 2021-01-26 DIAGNOSIS — Z23 Encounter for immunization: Secondary | ICD-10-CM | POA: Diagnosis not present

## 2021-01-26 DIAGNOSIS — J811 Chronic pulmonary edema: Secondary | ICD-10-CM | POA: Diagnosis not present

## 2021-01-26 DIAGNOSIS — O133 Gestational [pregnancy-induced] hypertension without significant proteinuria, third trimester: Secondary | ICD-10-CM

## 2021-01-26 DIAGNOSIS — N939 Abnormal uterine and vaginal bleeding, unspecified: Secondary | ICD-10-CM | POA: Insufficient documentation

## 2021-01-26 DIAGNOSIS — O99513 Diseases of the respiratory system complicating pregnancy, third trimester: Secondary | ICD-10-CM | POA: Diagnosis not present

## 2021-01-26 DIAGNOSIS — O134 Gestational [pregnancy-induced] hypertension without significant proteinuria, complicating childbirth: Secondary | ICD-10-CM | POA: Diagnosis not present

## 2021-01-26 DIAGNOSIS — Z87891 Personal history of nicotine dependence: Secondary | ICD-10-CM | POA: Insufficient documentation

## 2021-01-26 DIAGNOSIS — Z3A36 36 weeks gestation of pregnancy: Secondary | ICD-10-CM

## 2021-01-26 DIAGNOSIS — Z20822 Contact with and (suspected) exposure to covid-19: Secondary | ICD-10-CM | POA: Diagnosis not present

## 2021-01-26 DIAGNOSIS — Z0371 Encounter for suspected problem with amniotic cavity and membrane ruled out: Secondary | ICD-10-CM

## 2021-01-26 HISTORY — DX: Unspecified ovarian cyst, unspecified side: N83.209

## 2021-01-26 HISTORY — DX: Dermatitis, unspecified: L30.9

## 2021-01-26 LAB — COMPREHENSIVE METABOLIC PANEL
ALT: 15 U/L (ref 0–44)
AST: 20 U/L (ref 15–41)
Albumin: 2.8 g/dL — ABNORMAL LOW (ref 3.5–5.0)
Alkaline Phosphatase: 134 U/L — ABNORMAL HIGH (ref 38–126)
Anion gap: 8 (ref 5–15)
BUN: 7 mg/dL (ref 6–20)
CO2: 20 mmol/L — ABNORMAL LOW (ref 22–32)
Calcium: 8.8 mg/dL — ABNORMAL LOW (ref 8.9–10.3)
Chloride: 105 mmol/L (ref 98–111)
Creatinine, Ser: 0.52 mg/dL (ref 0.44–1.00)
GFR, Estimated: >60 mL/min (ref >60)
Glucose, Bld: 79 mg/dL (ref 70–99)
Potassium: 3.9 mmol/L (ref 3.5–5.1)
Sodium: 133 mmol/L — ABNORMAL LOW (ref 135–145)
Total Bilirubin: 0.2 mg/dL — ABNORMAL LOW (ref 0.3–1.2)
Total Protein: 6 g/dL — ABNORMAL LOW (ref 6.5–8.1)

## 2021-01-26 LAB — CBC
HCT: 38.8 % (ref 36.0–46.0)
Hemoglobin: 13.9 g/dL (ref 12.0–15.0)
MCH: 33.1 pg (ref 26.0–34.0)
MCHC: 35.8 g/dL (ref 30.0–36.0)
MCV: 92.4 fL (ref 80.0–100.0)
Platelets: 209 10*3/uL (ref 150–400)
RBC: 4.2 MIL/uL (ref 3.87–5.11)
RDW: 14.2 % (ref 11.5–15.5)
WBC: 12.5 10*3/uL — ABNORMAL HIGH (ref 4.0–10.5)
nRBC: 0 % (ref 0.0–0.2)

## 2021-01-26 LAB — PROTEIN / CREATININE RATIO, URINE
Creatinine, Urine: 98.7 mg/dL
Protein Creatinine Ratio: 0.13 mg/mg{Cre} (ref 0.00–0.15)
Total Protein, Urine: 13 mg/dL

## 2021-01-26 LAB — URINALYSIS, ROUTINE W REFLEX MICROSCOPIC
Bilirubin Urine: NEGATIVE
Glucose, UA: NEGATIVE mg/dL
Ketones, ur: NEGATIVE mg/dL
Leukocytes,Ua: NEGATIVE
Nitrite: NEGATIVE
Protein, ur: NEGATIVE mg/dL
Specific Gravity, Urine: 1.018 (ref 1.005–1.030)
pH: 5 (ref 5.0–8.0)

## 2021-01-26 LAB — AMNISURE RUPTURE OF MEMBRANE (ROM) NOT AT ARMC: Amnisure ROM: NEGATIVE

## 2021-01-26 MED ORDER — ACETAMINOPHEN 500 MG PO TABS
1000.0000 mg | ORAL_TABLET | Freq: Once | ORAL | Status: AC
  Administered 2021-01-26: 1000 mg via ORAL
  Filled 2021-01-26: qty 2

## 2021-01-26 NOTE — MAU Note (Signed)
Sent from office for further eval.  BP at home was elevated, again this morning 140's/90's.  Had a HA this morning, so mild did not take anything for it, has gotten worse.  Has been having some irregular ctxs the past wk. Cx was closed.  Small amt of bleeding after exam. Seeing some floaters, denies epigastric pain or increase in swelling. Had a small gush of fluid while sitting waiting for dr, some tests were done in office- all neg- wanting further eval on that also while here.

## 2021-01-26 NOTE — MAU Provider Note (Signed)
History     [id number]  Arrival date and time: 01/26/21 1314    Chief Complaint  Patient presents with   Contractions   Headache   Hypertension     HPI Joyce Massey is a 30 y.o. at 82w3dwho presents for blood pressure evaluation. Patient was seen by Dr. LRoyston Sinnerearlier today & had an elevated BP. Per the patient, they have been watching her BPs for the last few weeks. Denies hypertension outside of pregnancy. Reports headaches that she rates 5/10. Hasn't treated symptoms. No visual disturbance or epigastric pain.  While she was in the office she had a gush of clear fluid. This was evaluated by Dr. LRoyston Sinner& results were negative, but [facility] due to amount of leaking in the office.    Vaginal bleeding: No LOF: Yes Fetal Movement: Yes Contractions: Yes  Review of outside prenatal records from [facility] (in media tab)     OB History     Gravida  2   Para  0   Term  0   Preterm  0   AB  1   Living  0      SAB  0   IAB  0   Ectopic  1   Multiple  0   Live Births  0        Obstetric Comments  Ectopic treated with Methotrexate         Past Medical History:  Diagnosis Date   ADHD (attention deficit hyperactivity disorder)    Anxiety    Eczema    Endometriosis, mild    GERD (gastroesophageal reflux disease)    History of gastric ulcer    Ovarian cyst    Pelvic pain     Past Surgical History:  Procedure Laterality Date   LAPAROSCOPIC CHOLECYSTECTOMY  2012  approx.   LAPAROSCOPIC OVARIAN CYSTECTOMY Left 07/12/2016   Procedure: OPERATIVE LAPAROSCOPIC OVARIAN CYSTECTOMY AND PERITONEAL BIOPSY;  Surgeon: VEldred Manges MD;  [place] [facility]  Service: Gynecology;  Laterality: Left;    Family History  Problem Relation Age of Onset   Healthy Mother    Hypertension Father    Heart disease Maternal Grandmother     Social History   Socioeconomic History   Marital status: Married     Spouse name: Not on file   Number of children: Not on file   Years of education: Not on file   Highest education level: Not on file  Occupational History   Not on file  Tobacco Use   Smoking status: Former    Packs/day: 0.50    Years: 5.00    Pack years: 2.50    Types: Cigarettes    Quit date: 07/09/2014    Years since quitting: 6.5   Smokeless tobacco: Never  Vaping Use   Vaping Use: Never used  Substance and Sexual Activity   Alcohol use: Not Currently    Alcohol/week: 2.0 standard drinks    Types: 2 Standard drinks or equivalent per week    Comment: occasional   Drug use: No   Sexual activity: Yes    Partners: Male    Birth control/protection: None  Other Topics Concern   Not on file  Social History Narrative   Not on file   Social Determinants of Health   Financial Resource Strain: Not on file  Food Insecurity: Not on file  Transportation Needs: Not on file  Physical Activity: Not on file  Stress: Not on file  Social Connections: Not on file  Intimate Partner Violence: Not on file    Allergies  Allergen Reactions   Wasp Venom Protein Swelling   Latex Rash   Tape Rash    Some tapes cause rashes (adhesives may be the reason)    No current facility-administered medications on file prior to encounter.   Current Outpatient Medications on File Prior to Encounter  Medication Sig Dispense Refill   famotidine (PEPCID) 20 MG tablet Take 20 mg by mouth 2 (two) times daily.     fluticasone (FLONASE) 50 MCG/ACT nasal spray Place 1 spray into both nostrils daily.     Prenatal Vit-Fe Fumarate-FA (PRENATAL VITAMIN PO) Take by mouth.     valACYclovir (VALTREX) 500 MG tablet TAKE 1 TABLET BY MOUTH TWICE DAILY FOR 3 DAYS. 30 tablet 3   Crisaborole 2 % OINT Apply topically 2 (two) times daily as needed.      Doxylamine-Pyridoxine 10-10 MG TBEC TAKE 2 TABLETS BY MOUTH DAILY. 60 tablet 2   ondansetron (ZOFRAN) 8 MG tablet Take 1 tablet (8 mg total) by mouth every 8 (eight)  hours as needed for nausea or vomiting. 30 tablet 0     ROS Pertinent positives and negative per HPI, all others reviewed and negative  Physical Exam   BP 127/77   Pulse 76   Temp 98.6 F (37 C) (Oral)   Resp 20   Wt 101.1 kg   SpO2 97%   BMI 34.91 kg/m   Patient Vitals for the past 24 hrs:  BP Temp Temp src Pulse Resp SpO2 Weight  01/26/21 1601 127/77 -- -- 76 -- -- --  01/26/21 1546 118/65 -- -- 82 -- -- --  01/26/21 1531 115/68 -- -- 80 -- -- --  01/26/21 1530 -- -- -- -- -- 97 % --  01/26/21 1516 (!) 111/51 -- -- 72 -- -- --  01/26/21 1501 128/77 -- -- 80 -- -- --  01/26/21 1446 122/83 -- -- 83 -- -- --  01/26/21 1430 122/90 -- -- 80 -- 99 % --  01/26/21 1359 130/89 98.6 F (37 C) Oral 89 20 -- --  01/26/21 1355 -- -- -- -- -- 99 % --  01/26/21 1349 (!) 145/85 -- -- 86 -- -- --  01/26/21 1332 -- -- -- -- -- -- 101.1 kg    Physical Exam Vitals and nursing note reviewed.  Constitutional:      General: She is not in acute distress.    Appearance: She is well-developed.  HENT:     Head: Normocephalic and atraumatic.  Eyes:     General: No scleral icterus. Cardiovascular:     Heart sounds: Normal heart sounds.  Pulmonary:     Effort: Pulmonary effort is normal. No respiratory distress.     Breath sounds: Normal breath sounds.  Skin:    General: Skin is warm and dry.  Neurological:     Mental Status: She is alert.     Deep Tendon Reflexes:     Reflex Scores:      Patellar reflexes are 2+ on the right side and 2+ on the left side.   FHT Baseline 145, moderate variability, 15x15 accels, no decels Cat: 1  Labs Results for orders placed or performed during the hospital encounter of 01/26/21 (from the past 24 hour(s))  Urinalysis, Routine w reflex microscopic Urine, Clean Catch     Status: Abnormal   Collection Time: 01/26/21  1:40 PM  Result Value Ref Range   Color, Urine YELLOW YELLOW   APPearance HAZY (A) CLEAR   Specific Gravity, Urine 1.018 1.005 -  1.030   pH 5.0 5.0 - 8.0   Glucose, UA NEGATIVE NEGATIVE mg/dL   Hgb urine dipstick LARGE (A) NEGATIVE   Bilirubin Urine NEGATIVE NEGATIVE   Ketones, ur NEGATIVE NEGATIVE mg/dL   Protein, ur NEGATIVE NEGATIVE mg/dL   Nitrite NEGATIVE NEGATIVE   Leukocytes,Ua NEGATIVE NEGATIVE   RBC / HPF 0-5 0 - 5 RBC/hpf   WBC, UA 0-5 0 - 5 WBC/hpf   Bacteria, UA RARE (A) NONE SEEN   Squamous Epithelial / LPF 0-5 0 - 5   Mucus PRESENT   Protein / creatinine ratio, urine     Status: None   Collection Time: 01/26/21  1:40 PM  Result Value Ref Range   Creatinine, Urine 98.70 mg/dL   Total Protein, Urine 13 mg/dL   Protein Creatinine Ratio 0.13 0.00 - 0.15 mg/mg[Cre]  Amnisure rupture of membrane (rom)not at [facility]     Status: None   Collection Time: 01/26/21  2:18 PM  Result Value Ref Range   Amnisure ROM NEGATIVE   CBC     Status: Abnormal   Collection Time: 01/26/21  2:30 PM  Result Value Ref Range   WBC 12.5 (H) 4.0 - 10.5 K/uL   RBC 4.20 3.87 - 5.11 MIL/uL   Hemoglobin 13.9 12.0 - 15.0 g/dL   HCT 38.8 36.0 - 46.0 %   MCV 92.4 80.0 - 100.0 fL   MCH 33.1 26.0 - 34.0 pg   MCHC 35.8 30.0 - 36.0 g/dL   RDW 14.2 11.5 - 15.5 %   Platelets 209 150 - 400 K/uL   nRBC 0.0 0.0 - 0.2 %  Comprehensive metabolic panel     Status: Abnormal   Collection Time: 01/26/21  2:30 PM  Result Value Ref Range   Sodium 133 (L) 135 - 145 mmol/L   Potassium 3.9 3.5 - 5.1 mmol/L   Chloride 105 98 - 111 mmol/L   CO2 20 (L) 22 - 32 mmol/L   Glucose, Bld 79 70 - 99 mg/dL   BUN 7 6 - 20 mg/dL   Creatinine, Ser 0.52 0.44 - 1.00 mg/dL   Calcium 8.8 (L) 8.9 - 10.3 mg/dL   Total Protein 6.0 (L) 6.5 - 8.1 g/dL   Albumin 2.8 (L) 3.5 - 5.0 g/dL   AST 20 15 - 41 U/L   ALT 15 0 - 44 U/L   Alkaline Phosphatase 134 (H) 38 - 126 U/L   Total Bilirubin 0.2 (L) 0.3 - 1.2 mg/dL   GFR, Estimated >60 >60 mL/min   Anion gap 8 5 - 15    Imaging No results found.  MAU Course  Procedures Lab Orders         Urinalysis,  Routine w reflex microscopic Urine, Clean Catch         CBC         Comprehensive metabolic panel         Protein / creatinine ratio, urine         Amnisure rupture of membrane (rom)not at [facility]    Meds ordered this encounter  Medications   acetaminophen (TYLENOL) tablet 1,000 mg   Imaging Orders  No imaging studies ordered today    MDM LOF- per Dr. Royston Sinner who called MAU prior to patient's arrival - she had negative fern & nitrazine in the office. Amnisure in MAU is negative.  HTN- patient with some continued BP elevation in MAU. Preeclampsia labs normal & no severe range VS. Headache resolved with Tylenol. Dr. Mardelle Matte called to ensure 37 week induction would be scheduled. Patient stable for discharge. Discussed preeclampsia precautions & reasons to return  Assessment and Plan   1. Gestational hypertension, third trimester   2. Encounter for suspected PROM, with rupture of membranes not found   3. [redacted] weeks gestation of pregnancy    -Will be scheduled for 37 week induction by her OB for gestational hypertension -reviewed preeclampsia precautions  Jorje Guild, NP

## 2021-01-26 NOTE — MAU Note (Signed)
Phleb called for blood draw.

## 2021-01-29 ENCOUNTER — Other Ambulatory Visit: Payer: Self-pay

## 2021-01-29 ENCOUNTER — Encounter (HOSPITAL_COMMUNITY): Payer: Self-pay | Admitting: Obstetrics and Gynecology

## 2021-01-29 ENCOUNTER — Inpatient Hospital Stay (HOSPITAL_COMMUNITY): Payer: 59

## 2021-01-29 ENCOUNTER — Inpatient Hospital Stay (HOSPITAL_COMMUNITY)
Admission: AD | Admit: 2021-01-29 | Discharge: 2021-02-02 | DRG: 788 | Disposition: A | Discharge status: Home / Self Care | Payer: 59 | Attending: Obstetrics & Gynecology | Admitting: Obstetrics & Gynecology

## 2021-01-29 DIAGNOSIS — Z3A37 37 weeks gestation of pregnancy: Secondary | ICD-10-CM | POA: Diagnosis not present

## 2021-01-29 DIAGNOSIS — O99513 Diseases of the respiratory system complicating pregnancy, third trimester: Secondary | ICD-10-CM | POA: Diagnosis not present

## 2021-01-29 DIAGNOSIS — J811 Chronic pulmonary edema: Secondary | ICD-10-CM | POA: Diagnosis not present

## 2021-01-29 DIAGNOSIS — Z23 Encounter for immunization: Secondary | ICD-10-CM | POA: Diagnosis not present

## 2021-01-29 DIAGNOSIS — Z3A36 36 weeks gestation of pregnancy: Secondary | ICD-10-CM

## 2021-01-29 DIAGNOSIS — R0989 Other specified symptoms and signs involving the circulatory and respiratory systems: Secondary | ICD-10-CM

## 2021-01-29 DIAGNOSIS — Z87891 Personal history of nicotine dependence: Secondary | ICD-10-CM | POA: Diagnosis not present

## 2021-01-29 DIAGNOSIS — Z20822 Contact with and (suspected) exposure to covid-19: Secondary | ICD-10-CM | POA: Diagnosis present

## 2021-01-29 DIAGNOSIS — O134 Gestational [pregnancy-induced] hypertension without significant proteinuria, complicating childbirth: Secondary | ICD-10-CM | POA: Diagnosis present

## 2021-01-29 DIAGNOSIS — O1414 Severe pre-eclampsia complicating childbirth: Secondary | ICD-10-CM | POA: Diagnosis not present

## 2021-01-29 DIAGNOSIS — O1490 Unspecified pre-eclampsia, unspecified trimester: Secondary | ICD-10-CM

## 2021-01-29 DIAGNOSIS — Z98891 History of uterine scar from previous surgery: Secondary | ICD-10-CM

## 2021-01-29 DIAGNOSIS — Z3490 Encounter for supervision of normal pregnancy, unspecified, unspecified trimester: Secondary | ICD-10-CM

## 2021-01-29 DIAGNOSIS — O133 Gestational [pregnancy-induced] hypertension without significant proteinuria, third trimester: Secondary | ICD-10-CM

## 2021-01-29 DIAGNOSIS — R0602 Shortness of breath: Secondary | ICD-10-CM | POA: Diagnosis not present

## 2021-01-29 LAB — CBC WITH DIFFERENTIAL/PLATELET
Abs Immature Granulocytes: 0.08 10*3/uL — ABNORMAL HIGH (ref 0.00–0.07)
Basophils Absolute: 0 10*3/uL (ref 0.0–0.1)
Basophils Relative: 0 %
Eosinophils Absolute: 0 10*3/uL (ref 0.0–0.5)
Eosinophils Relative: 0 %
HCT: 38.8 % (ref 36.0–46.0)
Hemoglobin: 13.7 g/dL (ref 12.0–15.0)
Immature Granulocytes: 1 %
Lymphocytes Relative: 22 %
Lymphs Abs: 2.4 10*3/uL (ref 0.7–4.0)
MCH: 32.8 pg (ref 26.0–34.0)
MCHC: 35.3 g/dL (ref 30.0–36.0)
MCV: 92.8 fL (ref 80.0–100.0)
Monocytes Absolute: 0.9 10*3/uL (ref 0.1–1.0)
Monocytes Relative: 9 %
Neutro Abs: 7.3 10*3/uL (ref 1.7–7.7)
Neutrophils Relative %: 68 %
Platelets: 216 10*3/uL (ref 150–400)
RBC: 4.18 MIL/uL (ref 3.87–5.11)
RDW: 14.2 % (ref 11.5–15.5)
WBC: 10.7 10*3/uL — ABNORMAL HIGH (ref 4.0–10.5)
nRBC: 0 % (ref 0.0–0.2)

## 2021-01-29 LAB — COMPREHENSIVE METABOLIC PANEL
ALT: 15 U/L (ref 0–44)
AST: 23 U/L (ref 15–41)
Albumin: 2.6 g/dL — ABNORMAL LOW (ref 3.5–5.0)
Alkaline Phosphatase: 136 U/L — ABNORMAL HIGH (ref 38–126)
Anion gap: 9 (ref 5–15)
BUN: 8 mg/dL (ref 6–20)
CO2: 19 mmol/L — ABNORMAL LOW (ref 22–32)
Calcium: 9.3 mg/dL (ref 8.9–10.3)
Chloride: 106 mmol/L (ref 98–111)
Creatinine, Ser: 0.52 mg/dL (ref 0.44–1.00)
GFR, Estimated: >60 mL/min (ref >60)
Glucose, Bld: 76 mg/dL (ref 70–99)
Potassium: 4.1 mmol/L (ref 3.5–5.1)
Sodium: 134 mmol/L — ABNORMAL LOW (ref 135–145)
Total Bilirubin: 0.5 mg/dL (ref 0.3–1.2)
Total Protein: 5.6 g/dL — ABNORMAL LOW (ref 6.5–8.1)

## 2021-01-29 LAB — TYPE AND SCREEN
ABO/RH(D): O POS
Antibody Screen: NEGATIVE

## 2021-01-29 LAB — PROTEIN / CREATININE RATIO, URINE
Creatinine, Urine: 25.38 mg/dL
Total Protein, Urine: <6 mg/dL

## 2021-01-29 LAB — RESP PANEL BY RT-PCR (FLU A&B, COVID) ARPGX2
Influenza A by PCR: NEGATIVE
Influenza B by PCR: NEGATIVE
SARS Coronavirus 2 by RT PCR: NEGATIVE

## 2021-01-29 MED ORDER — OXYCODONE HCL 5 MG PO TABS
10.0000 mg | ORAL_TABLET | ORAL | Status: AC
  Administered 2021-01-29: 10 mg via ORAL
  Filled 2021-01-29: qty 2

## 2021-01-29 MED ORDER — LABETALOL HCL 5 MG/ML IV SOLN: 40.0000 mg | INTRAVENOUS | Status: DC | PRN

## 2021-01-29 MED ORDER — SOD CITRATE-CITRIC ACID 500-334 MG/5ML PO SOLN
30.0000 mL | ORAL | Status: DC | PRN
  Administered 2021-01-29 – 2021-01-31 (×5): 30 mL via ORAL
  Filled 2021-01-29 (×5): qty 30

## 2021-01-29 MED ORDER — TERBUTALINE SULFATE 1 MG/ML IJ SOLN: 0.2500 mg | Freq: Once | INTRAMUSCULAR | Status: DC | PRN

## 2021-01-29 MED ORDER — LACTATED RINGERS IV SOLN: 500.0000 mL | INTRAVENOUS | Status: DC | PRN

## 2021-01-29 MED ORDER — MAGNESIUM SULFATE 40 GM/1000ML IV SOLN
1.0000 g/h | INTRAVENOUS | Status: DC
  Administered 2021-01-29: 2 g/h via INTRAVENOUS
  Administered 2021-01-30: 1 g/h via INTRAVENOUS
  Filled 2021-01-29 (×2): qty 1000

## 2021-01-29 MED ORDER — BUTORPHANOL TARTRATE 1 MG/ML IJ SOLN
1.0000 mg | INTRAMUSCULAR | Status: DC | PRN
  Administered 2021-01-30 (×2): 1 mg via INTRAVENOUS
  Filled 2021-01-29 (×2): qty 1

## 2021-01-29 MED ORDER — HYDRALAZINE HCL 10 MG PO TABS
10.0000 mg | ORAL_TABLET | Freq: Three times a day (TID) | ORAL | Status: DC | PRN
  Filled 2021-01-29: qty 1

## 2021-01-29 MED ORDER — LABETALOL HCL 5 MG/ML IV SOLN: 80.0000 mg | INTRAVENOUS | Status: DC | PRN

## 2021-01-29 MED ORDER — PENICILLIN G POT IN DEXTROSE 60000 UNIT/ML IV SOLN
3.0000 10*6.[IU] | INTRAVENOUS | Status: DC
  Administered 2021-01-30 – 2021-01-31 (×6): 3 10*6.[IU] via INTRAVENOUS
  Filled 2021-01-29 (×6): qty 50

## 2021-01-29 MED ORDER — ONDANSETRON HCL 4 MG/2ML IJ SOLN
4.0000 mg | Freq: Four times a day (QID) | INTRAMUSCULAR | Status: DC | PRN
  Administered 2021-01-30 (×2): 4 mg via INTRAVENOUS
  Filled 2021-01-29 (×2): qty 2

## 2021-01-29 MED ORDER — ACETAMINOPHEN 325 MG PO TABS
650.0000 mg | ORAL_TABLET | ORAL | Status: DC | PRN
  Administered 2021-01-29 (×2): 650 mg via ORAL
  Filled 2021-01-29 (×2): qty 2

## 2021-01-29 MED ORDER — LIDOCAINE HCL (PF) 1 % IJ SOLN: 30.0000 mL | INTRAMUSCULAR | Status: DC | PRN

## 2021-01-29 MED ORDER — FUROSEMIDE 10 MG/ML IJ SOLN
40.0000 mg | Freq: Once | INTRAMUSCULAR | Status: AC
  Administered 2021-01-29: 40 mg via INTRAVENOUS
  Filled 2021-01-29 (×2): qty 4

## 2021-01-29 MED ORDER — SODIUM CHLORIDE 0.9 % IV SOLN
5.0000 10*6.[IU] | Freq: Once | INTRAVENOUS | Status: AC
  Administered 2021-01-29: 5 10*6.[IU] via INTRAVENOUS
  Filled 2021-01-29: qty 5

## 2021-01-29 MED ORDER — ZOLPIDEM TARTRATE 5 MG PO TABS: 5.0000 mg | ORAL_TABLET | Freq: Every evening | ORAL | Status: DC | PRN

## 2021-01-29 MED ORDER — OXYTOCIN-SODIUM CHLORIDE 30-0.9 UT/500ML-% IV SOLN: 2.5000 [IU]/h | INTRAVENOUS | Status: DC

## 2021-01-29 MED ORDER — MISOPROSTOL 25 MCG QUARTER TABLET
25.0000 ug | ORAL_TABLET | ORAL | Status: DC | PRN
  Administered 2021-01-29 – 2021-01-30 (×3): 25 ug via VAGINAL
  Filled 2021-01-29 (×3): qty 1

## 2021-01-29 MED ORDER — LABETALOL HCL 5 MG/ML IV SOLN
20.0000 mg | INTRAVENOUS | Status: DC | PRN
Start: 2021-01-29 — End: 2021-01-31

## 2021-01-29 MED ORDER — OXYCODONE-ACETAMINOPHEN 5-325 MG PO TABS: 2.0000 | ORAL_TABLET | ORAL | Status: DC | PRN

## 2021-01-29 MED ORDER — OXYCODONE-ACETAMINOPHEN 5-325 MG PO TABS: 1.0000 | ORAL_TABLET | ORAL | Status: DC | PRN

## 2021-01-29 MED ORDER — OXYTOCIN-SODIUM CHLORIDE 30-0.9 UT/500ML-% IV SOLN
1.0000 m[IU]/min | INTRAVENOUS | Status: DC
  Administered 2021-01-31: 2 m[IU]/min via INTRAVENOUS
  Filled 2021-01-29: qty 500

## 2021-01-29 MED ORDER — HYDROXYZINE HCL 50 MG PO TABS: 50.0000 mg | ORAL_TABLET | Freq: Four times a day (QID) | ORAL | Status: DC | PRN

## 2021-01-29 MED ORDER — OXYTOCIN BOLUS FROM INFUSION: 333.0000 mL | Freq: Once | INTRAVENOUS | Status: DC

## 2021-01-29 MED ORDER — LACTATED RINGERS IV SOLN
INTRAVENOUS | Status: DC
  Administered 2021-01-29: 1000 mL via INTRAVENOUS
  Administered 2021-01-31: 125 mL via INTRAVENOUS

## 2021-01-29 NOTE — MAU Note (Signed)
Pt reports headache continues at 7 on 0-10 scale. Denies visual changes or epigastric pain. Reports that she continues to have contractions. FHR tracing Cat 1. Awaiting room in LD.

## 2021-01-29 NOTE — MAU Note (Signed)
Present for BP evaluation, states BP taken @ home was 165/95.  Reports blurred vision and floaters as well as slight H/A.  Denies epigastric pain.  Also reports decreased FM, states baby has only moved 3 times today, despite eating & drinking.  Pt reports SOB, but states she's anxious. Denies LOF, has dark brown bloody discharge.  States had cervix check on Thursday.

## 2021-01-29 NOTE — H&P (Addendum)
Joyce Massey is a 30 y.o. female presenting for elevated blood pressures, SOB, and mild HA. Evaluated in the office a few days ago for BP elevation and normal labs. 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 She has had on and off (not persistent) mild blurry vision. She has new onset of HA while waiting in the MAU 6/10 and has not had any medications yet. The SOB is mild but noticeably different. She denies chest pain and worsening swelling - continues to have +1 edema.   OB History     Gravida  2   Para  0   Term  0   Preterm  0   AB  1   Living  0      SAB  0   IAB  0   Ectopic  1   Multiple  0   Live Births  0        Obstetric Comments  Ectopic treated with Methotrexate        Past Medical History:  Diagnosis Date   ADHD (attention deficit hyperactivity disorder)    Anxiety    Eczema    Endometriosis, mild    GERD (gastroesophageal reflux disease)    History of gastric ulcer    Ovarian cyst    Pelvic pain    Past Surgical History:  Procedure Laterality Date   LAPAROSCOPIC CHOLECYSTECTOMY  2012  approx.   LAPAROSCOPIC OVARIAN CYSTECTOMY Left 07/12/2016   Procedure: OPERATIVE LAPAROSCOPIC OVARIAN CYSTECTOMY AND PERITONEAL BIOPSY;  Surgeon: Eldred Manges, MD;  Location: [place];  Service: Gynecology;  Laterality: Left;   Family History: family history includes Healthy in her mother; Heart disease in her maternal grandmother; Hypertension in her father. Social History:  reports that she quit smoking about 6 years ago. Her smoking use included cigarettes. She has a 2.50 pack-year smoking history. She has never used smokeless tobacco. She reports that she does not currently use alcohol after a past usage of about 2.0 standard drinks per week. She reports that she does not use drugs.     Maternal Diabetes: No Genetic Screening:  Normal Maternal Ultrasounds/Referrals: Normal Fetal Ultrasounds or other Referrals:  None Maternal Substance Abuse:  No Significant Maternal Medications:  None Significant Maternal Lab Results:  None Other Comments:  None  Review of Systems History   Blood pressure (!) 124/94, pulse 94, temperature 98.2 F (36.8 C), temperature source Oral, resp. rate 19, height '5\' 7"'$  (1.702 m), weight 102.7 kg, SpO2 99 %, unknown if currently breastfeeding. Exam Physical Exam  NAD, A&O RRR NWOB, lungs sound clear despite CXR read of edema Abd soft, nondistended, gravid  Prenatal labs: ABO, Rh:  O+ Antibody:  Negative Rubella:  Immune RPR:   NR HBsAg:   Negative HIV:   NR GBS:   Unknown  Assessment/Plan: 30 yo G2P0 (prior ectopic treated with MTX) @ 36.6 wga presenting with PIH with likely severe features. CXR shows no overt pulmonary edema but "pre-pulmonary edema". Given symptomatic and about to be 37 weeks in a few hours, will go ahead and proceed with IOL.  She is counseled in detail regarding PIH, the spectrum, the complications, the treatment, and the future repercussions.   - PIH labs WNL, UPC normal - HA - not yet treated, will treat.  - given pre-pulmonary edema, will defer loading dose of Magnesium and only  do 2g an hour.  - Continuous pulse ox - if any desaturation - defer repeat CXR unless oxygen levels concerning - IV lasix '40mg'$  x 1, will not be aggressive as we want her to stay hydrated - strict I and O's - We discussed induction of labor versus moving ahead with a cesarean section given pulmonary edema and the possibility of a prolonged IOL and fetal intolerance s/s uteroplacental insufficiency.  She strongly desires VTOL.  - SVE unfavorable - 0/thick/-3 - plan cytotec followed by pitocin/arom. May need foley balloon prn.  - PCN per protocol given preterm and GBS unknown - Spoke with MFM Dr. Gertie Exon who agrees with this plan   Tyson Dense 01/29/2021, 4:09  PM

## 2021-01-29 NOTE — MAU Provider Note (Signed)
History     CSN: [id number]  Arrival date and time: 01/29/21 1140   Event Date/Time   First Provider Initiated Contact with Patient 01/29/21 1258      Chief Complaint  Patient presents with   BP Evalaution   Decreased Fetal Movement   Shortness of Breath   [name] is a 30 y.o. G2P0010 at 8w6dwho presents to MAU for preeclampsia evaluation after she started experiencing shortness of breath at rest, a mild headache, new onset nausea, facial swelling and increased swelling of the hands. Patient states she has been diagnosed with gestational hypertension and was supposed to be called for an induction at 37 weeks, but states she was not called for an appointment and when she called the hospital to schedule herself was not able to make an appointment as she was told there were too many inductions already.  Pt denies blurry vision/seeing spots, vomiting, epigastric pain, sudden weight gain. Pt denies chest pain.  Pt denies constipation, diarrhea, or urinary problems. Pt denies fever, chills, fatigue, sweating or changes in appetite. Pt denies dizziness, light-headedness, weakness.  Pt denies VB, ctx, LOF and reports good FM.   OB History     Gravida  2   Para  0   Term  0   Preterm  0   AB  1   Living  0      SAB  0   IAB  0   Ectopic  1   Multiple  0   Live Births  0        Obstetric Comments  Ectopic treated with Methotrexate         Past Medical History:  Diagnosis Date   ADHD (attention deficit hyperactivity disorder)    Anxiety    Eczema    Endometriosis, mild    GERD (gastroesophageal reflux disease)    History of gastric ulcer    Ovarian cyst    Pelvic pain     Past Surgical History:  Procedure Laterality Date   LAPAROSCOPIC CHOLECYSTECTOMY  2012  approx.   LAPAROSCOPIC OVARIAN CYSTECTOMY Left 07/12/2016   Procedure: OPERATIVE LAPAROSCOPIC OVARIAN CYSTECTOMY AND PERITONEAL BIOPSY;  Surgeon: VEldred Manges MD;  [facility]  [facility]  Service: Gynecology;  Laterality: Left;    Family History  Problem Relation Age of Onset   Healthy Mother    Hypertension Father    Heart disease Maternal Grandmother     Social History   Tobacco Use   Smoking status: Former    Packs/day: 0.50    Years: 5.00    Pack years: 2.50    Types: Cigarettes    Quit date: 07/09/2014    Years since quitting: 6.5   Smokeless tobacco: Never  Vaping Use   Vaping Use: Never used  Substance Use Topics   Alcohol use: Not Currently    Alcohol/week: 2.0 standard drinks    Types: 2 Standard drinks or equivalent per week    Comment: occasional   Drug use: No    Allergies:  Allergies  Allergen Reactions   Wasp Venom Protein Swelling   Latex Rash   Tape Rash    Some tapes cause rashes (adhesives may be the reason)    Medications Prior to Admission  Medication Sig Dispense Refill Last Dose   famotidine (PEPCID) 20 MG tablet Take 20 mg by mouth 2 (two) times daily.   01/29/2021   fluticasone (FLONASE) 50 MCG/ACT nasal spray Place 1 spray into  both nostrils daily.   01/28/2021   Prenatal Vit-Fe Fumarate-FA (PRENATAL VITAMIN PO) Take by mouth.   01/28/2021   valACYclovir (VALTREX) 500 MG tablet TAKE 1 TABLET BY MOUTH TWICE DAILY FOR 3 DAYS. 30 tablet 3 01/28/2021   Crisaborole 2 % OINT Apply topically 2 (two) times daily as needed.       Doxylamine-Pyridoxine 10-10 MG TBEC TAKE 2 TABLETS BY MOUTH DAILY. 60 tablet 2    ondansetron (ZOFRAN) 8 MG tablet Take 1 tablet (8 mg total) by mouth every 8 (eight) hours as needed for nausea or vomiting. 30 tablet 0     Review of Systems  Constitutional:  Negative for chills, diaphoresis, fatigue and fever.  Eyes:  Negative for visual disturbance.  Respiratory:  Positive for shortness of breath.   Cardiovascular:  Negative for chest pain.  Gastrointestinal:  Positive for nausea. Negative for abdominal pain, constipation, diarrhea and vomiting.  Genitourinary:  Negative for  dysuria, flank pain, frequency, pelvic pain, urgency, vaginal bleeding and vaginal discharge.  Musculoskeletal:        Swelling of face and hands.  Neurological:  Positive for headaches. Negative for dizziness, weakness and light-headedness.   Physical Exam   Blood pressure (!) 146/107, pulse 92, temperature 98.2 F (36.8 C), temperature source Oral, resp. rate 19, height '5\' 7"'$  (1.702 m), weight 102.7 kg, SpO2 100 %, unknown if currently breastfeeding.  Patient Vitals for the past 24 hrs:  BP Temp Temp src Pulse Resp SpO2 Height Weight  01/29/21 1635 -- -- -- -- -- 100 % -- --  01/29/21 1630 (!) 146/107 -- -- 92 -- 98 % -- --  01/29/21 1625 -- -- -- -- -- 99 % -- --  01/29/21 1620 -- -- -- -- -- 99 % -- --  01/29/21 1615 (!) 139/97 -- -- 88 -- 99 % -- --  01/29/21 1610 -- -- -- -- -- 99 % -- --  01/29/21 1605 -- -- -- -- -- 99 % -- --  01/29/21 1600 137/89 -- -- 98 -- 99 % -- --  01/29/21 1555 -- -- -- -- -- 99 % -- --  01/29/21 1550 -- -- -- -- -- 99 % -- --  01/29/21 1545 (!) 124/94 -- -- 90 -- 99 % -- --  01/29/21 1540 -- -- -- -- -- 99 % -- --  01/29/21 1535 -- -- -- -- -- 100 % -- --  01/29/21 1530 133/88 -- -- 84 -- 99 % -- --  01/29/21 1525 -- -- -- -- -- 100 % -- --  01/29/21 1520 -- -- -- -- -- 100 % -- --  01/29/21 1516 130/85 -- -- -- -- -- -- --  01/29/21 1515 130/85 -- -- 87 -- 99 % -- --  01/29/21 1510 -- -- -- -- -- 99 % -- --  01/29/21 1505 -- -- -- -- -- 100 % -- --  01/29/21 1501 133/81 -- -- -- -- -- -- --  01/29/21 1500 133/81 -- -- 79 -- 100 % -- --  01/29/21 1455 -- -- -- -- -- 99 % -- --  01/29/21 1450 -- -- -- -- -- 99 % -- --  01/29/21 1447 134/83 -- -- -- -- -- -- --  01/29/21 1446 134/83 -- -- 82 -- -- -- --  01/29/21 1440 -- -- -- -- -- 99 % -- --  01/29/21 1435 -- -- -- -- -- 99 % -- --  01/29/21 1431 125/77 -- -- -- -- -- -- --  01/29/21 1430 125/77 -- -- 90 -- 99 % -- --  01/29/21 1425 -- -- -- -- -- 98 % -- --  01/29/21 1420 -- -- -- -- -- 98  % -- --  01/29/21 1416 123/84 -- -- -- -- -- -- --  01/29/21 1415 123/84 -- -- 78 -- 98 % -- --  01/29/21 1410 -- -- -- -- -- 98 % -- --  01/29/21 1405 -- -- -- -- -- 98 % -- --  01/29/21 1400 127/80 -- -- 87 -- 98 % -- --  01/29/21 1346 128/77 -- -- -- -- -- -- --  01/29/21 1345 128/77 -- -- -- -- -- -- --  01/29/21 1331 131/90 -- -- -- -- -- -- --  01/29/21 1330 131/90 -- -- -- -- -- -- --  01/29/21 1315 132/90 -- -- -- -- -- -- --  01/29/21 1302 135/84 -- -- -- -- -- -- --  01/29/21 1225 (!) 147/85 -- -- 94 -- 99 % -- --  01/29/21 1220 -- -- -- -- -- 99 % -- --  01/29/21 1201 (!) 146/95 98.2 F (36.8 C) Oral (!) 101 19 100 % -- --  01/29/21 1153 -- -- -- -- -- -- '5\' 7"'$  (1.702 m) 102.7 kg   Physical Exam Vitals and nursing note reviewed.  Constitutional:      General: She is not in acute distress.    Appearance: Normal appearance. She is not ill-appearing, toxic-appearing or diaphoretic.  HENT:     Head: Normocephalic and atraumatic.  Pulmonary:     Breath sounds: Normal breath sounds.     Comments: Increased work of breathing. Neurological:     Mental Status: She is alert and oriented to person, place, and time.  Psychiatric:        Mood and Affect: Mood normal.        Behavior: Behavior normal.        Thought Content: Thought content normal.        Judgment: Judgment normal.   Results for orders placed or performed during the hospital encounter of 01/29/21 (from the past 24 hour(s))  Protein / creatinine ratio, urine     Status: None   Collection Time: 01/29/21 12:51 PM  Result Value Ref Range   Creatinine, Urine 25.38 mg/dL   Total Protein, Urine <6 mg/dL   Protein Creatinine Ratio        0.00 - 0.15 mg/mg[Cre]  CBC with Differential/Platelet     Status: Abnormal   Collection Time: 01/29/21  1:02 PM  Result Value Ref Range   WBC 10.7 (H) 4.0 - 10.5 K/uL   RBC 4.18 3.87 - 5.11 MIL/uL   Hemoglobin 13.7 12.0 - 15.0 g/dL   HCT 38.8 36.0 - 46.0 %   MCV 92.8 80.0 -  100.0 fL   MCH 32.8 26.0 - 34.0 pg   MCHC 35.3 30.0 - 36.0 g/dL   RDW 14.2 11.5 - 15.5 %   Platelets 216 150 - 400 K/uL   nRBC 0.0 0.0 - 0.2 %   Neutrophils Relative % 68 %   Neutro Abs 7.3 1.7 - 7.7 K/uL   Lymphocytes Relative 22 %   Lymphs Abs 2.4 0.7 - 4.0 K/uL   Monocytes Relative 9 %   Monocytes Absolute 0.9 0.1 - 1.0 K/uL   Eosinophils Relative 0 %   Eosinophils Absolute 0.0 0.0 - 0.5 K/uL   Basophils Relative 0 %   Basophils Absolute 0.0 0.0 - 0.1 K/uL  Immature Granulocytes 1 %   Abs Immature Granulocytes 0.08 (H) 0.00 - 0.07 K/uL  Comprehensive metabolic panel     Status: Abnormal   Collection Time: 01/29/21  1:02 PM  Result Value Ref Range   Sodium 134 (L) 135 - 145 mmol/L   Potassium 4.1 3.5 - 5.1 mmol/L   Chloride 106 98 - 111 mmol/L   CO2 19 (L) 22 - 32 mmol/L   Glucose, Bld 76 70 - 99 mg/dL   BUN 8 6 - 20 mg/dL   Creatinine, Ser 0.52 0.44 - 1.00 mg/dL   Calcium 9.3 8.9 - 10.3 mg/dL   Total Protein 5.6 (L) 6.5 - 8.1 g/dL   Albumin 2.6 (L) 3.5 - 5.0 g/dL   AST 23 15 - 41 U/L   ALT 15 0 - 44 U/L   Alkaline Phosphatase 136 (H) 38 - 126 U/L   Total Bilirubin 0.5 0.3 - 1.2 mg/dL   GFR, Estimated >60 >60 mL/min   Anion gap 9 5 - 15   DG Chest 1 View  Result Date: 01/29/2021 CLINICAL DATA:  Shortness of breath. EXAM: CHEST  1 VIEW COMPARISON:  None. FINDINGS: Cardiomediastinal silhouette is normal. Mediastinal contours appear intact. There is no evidence of focal airspace consolidation, pleural effusion or pneumothorax. Minimal interstitial crowding. Osseous structures are without acute abnormality. Soft tissues are grossly normal. IMPRESSION: Minimal pulmonary vascular congestion. Electronically Signed   By: Fidela Salisbury M.D.   On: 01/29/2021 15:38    MAU Course  Procedures  MDM -preeclampsia evaluation without severe range BP in MAU on admission -symptoms include: HA, SOB, swelling in face/hands, nausea -CBC: H/H 13.7/38.8, platelets 216 -CMP: serum  creatinine 0.52, AST/ALT 23/15 -PCr: below reportable range -EFM: reactive       -baseline: 150/140       -variability: moderate       -accels: present, 15x15       -decels: absent       -TOCO: irregular ctx, pt not feeling -EKG: WNL -Chest X-ray: mild vascular congestion -lungs clear to auscultation -consulted with Dr. Royston Sinner, as patient has experienced increased symptoms, including SOB and will be 37 weeks at midnight with gHTN, will admit for induction -admit to L&D  Orders Placed This Encounter  Procedures   DG Chest 1 View    Standing Status:   Standing    Number of Occurrences:   1    Order Specific Question:   Symptom/Reason for Exam    Answer:   Shortness of breath [786.05.ICD-9-CM]   CBC with Differential/Platelet    Standing Status:   Standing    Number of Occurrences:   1   Comprehensive metabolic panel    Standing Status:   Standing    Number of Occurrences:   1   Protein / creatinine ratio, urine    Standing Status:   Standing    Number of Occurrences:   1   Cervical Exam    Unless contraindicated, every 1-2 hours in active labor, or at nurse's discretion    Standing Status:   Standing    Number of Occurrences:   1   Vital signs    Standing Status:   Standing    Number of Occurrences:   1   Notify physician (specify) Confirmatory reading of BP> 160/110 15 minutes later    Confirmatory reading of BP> 160/110 15 minutes later    Standing Status:   Standing    Number of Occurrences:   1  Order Specific Question:   Notify Physician    Answer:   Temp greater than or equal to 100.4    Order Specific Question:   Notify Physician    Answer:   RR greater than 24 or less than 10    Order Specific Question:   Notify Physician    Answer:   HR greater than 120 or less than 50    Order Specific Question:   Notify Physician    Answer:   SBP greater than 160 mmHG or less than 80 mmHG    Order Specific Question:   Notify Physician    Answer:   DBP greater than 110 mmHG  or less than 45 mmHG    Order Specific Question:   Notify Physician    Answer:   Urinary output is less than 145m for any 4 hour period   Fetal monitoring    Standing Status:   Standing    Number of Occurrences:   1   Continuous tocometry    Standing Status:   Standing    Number of Occurrences:   1   Weigh Patient    Standing Status:   Standing    Number of Occurrences:   1   Intake and output    Standing Status:   Standing    Number of Occurrences:   1   Measure blood pressure    20 minutes after giving hydralazine 10 MG IV dose.  Call MD if SBP >/= 160 or DBP >/= 110.    Standing Status:   Standing    Number of Occurrences:   1   Pulse oximetry, continuous    Standing Status:   Standing    Number of Occurrences:   1   EKG 12-Lead    Standing Status:   Standing    Number of Occurrences:   1   Seizure precautions    Decrease environmental stimulation    Standing Status:   Standing    Number of Occurrences:   1   Meds ordered this encounter  Medications   furosemide (LASIX) injection 40 mg   AND Linked Order Group    labetalol (NORMODYNE) injection 20 mg    labetalol (NORMODYNE) injection 40 mg    labetalol (NORMODYNE) injection 80 mg    hydrALAZINE (APRESOLINE) tablet 10 mg   Assessment and Plan   1. Gestational hypertension, third trimester   2. Shortness of breath   3. Pulmonary vascular congestion   4. [redacted] weeks gestation of pregnancy    -admit to L&D  NGerrie NordmannNugent 01/29/2021, 5:15 PM

## 2021-01-29 NOTE — MAU Note (Signed)
Dr.Leger called with pt complaints of unresolved headache. Order received for Roxicodone now and will plan to proceed with IV medication if po ineffective. Pt made aware of plan and is agreeable. Lungs clear to auscultation bilaterally. O2 sats remain 99-100%.

## 2021-01-30 ENCOUNTER — Encounter (HOSPITAL_COMMUNITY): Payer: Self-pay | Admitting: Obstetrics and Gynecology

## 2021-01-30 LAB — SYPHILIS: RPR W/REFLEX TO RPR TITER AND TREPONEMAL ANTIBODIES, TRADITIONAL SCREENING AND DIAGNOSIS ALGORITHM: RPR Ser Ql: NONREACTIVE

## 2021-01-30 MED ORDER — FENTANYL CITRATE (PF) 100 MCG/2ML IJ SOLN
100.0000 ug | INTRAMUSCULAR | Status: DC | PRN
Start: 2021-01-30 — End: 2021-01-31
  Administered 2021-01-30 (×2): 100 ug via INTRAVENOUS
  Filled 2021-01-30 (×3): qty 2

## 2021-01-30 MED ORDER — METOCLOPRAMIDE HCL 5 MG/ML IJ SOLN
10.0000 mg | Freq: Once | INTRAMUSCULAR | Status: AC
  Administered 2021-01-30: 10 mg via INTRAVENOUS
  Filled 2021-01-30: qty 2

## 2021-01-30 MED ORDER — CYCLOBENZAPRINE HCL 10 MG PO TABS
10.0000 mg | ORAL_TABLET | Freq: Three times a day (TID) | ORAL | Status: DC | PRN
  Administered 2021-01-30: 10 mg via ORAL
  Filled 2021-01-30: qty 2

## 2021-01-30 MED ORDER — MISOPROSTOL 50MCG HALF TABLET: 50.0000 ug | ORAL_TABLET | ORAL | Status: DC | PRN

## 2021-01-30 MED ORDER — MISOPROSTOL 50MCG HALF TABLET
50.0000 ug | ORAL_TABLET | ORAL | Status: DC | PRN
  Administered 2021-01-30 (×3): 50 ug via BUCCAL
  Filled 2021-01-30 (×3): qty 1

## 2021-01-30 MED ORDER — DIPHENHYDRAMINE HCL 50 MG/ML IJ SOLN
25.0000 mg | Freq: Once | INTRAMUSCULAR | Status: AC
  Administered 2021-01-30: 25 mg via INTRAVENOUS
  Filled 2021-01-30: qty 1

## 2021-01-30 NOTE — Consult Note (Signed)
MFM Brief Note  I discussed with Dr. Royston Sinner regarding Ms. Casasola's plan of care.   She is a 30 yo G2P0 who is presenting with elevated blood pressures with prior diagnosis of preeclampsia without severe features based on elevated blood pressure. She had plan for delivery at 37 weeks for this diagnosis. Her labs have been stable.    I was called as Dr. Royston Sinner suspected that Ms. Bastien may be developing pulmonary edema giving her the diagnosis of preeclamspsia with severe features. At that time and based on this information I  encouraged her to proceed with IOL of labor with MgSo4 for seizure prophylaxis given her diagnosis of preeclampsia with severe features, with strict input/output.  I was not formally consulted.  We discussed this case for 5-10 minutes.  Vikki Ports, MD.

## 2021-01-30 NOTE — Plan of Care (Signed)
  Problem: Education: Goal: Knowledge of Childbirth will improve Outcome: Progressing Goal: Ability to make informed decisions regarding treatment and plan of care will improve Outcome: Progressing Goal: Ability to state and carry out methods to decrease the pain will improve Outcome: Progressing Goal: Individualized Educational Video(s) Outcome: Progressing   Problem: Coping: Goal: Ability to verbalize concerns and feelings about labor and delivery will improve Outcome: Progressing   Problem: Life Cycle: Goal: Ability to make normal progression through stages of labor will improve Outcome: Progressing Goal: Ability to effectively push during vaginal delivery will improve Outcome: Progressing   Problem: Role Relationship: Goal: Will demonstrate positive interactions with the child Outcome: Progressing   Problem: Safety: Goal: Risk of complications during labor and delivery will decrease Outcome: Progressing   Problem: Pain Management: Goal: Relief or control of pain from uterine contractions will improve Outcome: Progressing   Problem: Education: Goal: Knowledge of General Education information will improve Description: Including pain rating scale, medication(s)/side effects and non-pharmacologic comfort measures Outcome: Progressing   Problem: Health Behavior/Discharge Planning: Goal: Ability to manage health-related needs will improve Outcome: Progressing   Problem: Clinical Measurements: Goal: Ability to maintain clinical measurements within normal limits will improve Outcome: Progressing Goal: Will remain free from infection Outcome: Progressing Goal: Diagnostic test results will improve Outcome: Progressing Goal: Respiratory complications will improve Outcome: Progressing Goal: Cardiovascular complication will be avoided Outcome: Progressing   Problem: Activity: Goal: Risk for activity intolerance will decrease Outcome: Progressing   Problem:  Nutrition: Goal: Adequate nutrition will be maintained Outcome: Progressing   Problem: Coping: Goal: Level of anxiety will decrease Outcome: Progressing   Problem: Elimination: Goal: Will not experience complications related to bowel motility Outcome: Progressing Goal: Will not experience complications related to urinary retention Outcome: Progressing   Problem: Pain Managment: Goal: General experience of comfort will improve Outcome: Progressing   Problem: Safety: Goal: Ability to remain free from injury will improve Outcome: Progressing   Problem: Skin Integrity: Goal: Risk for impaired skin integrity will decrease Outcome: Progressing   Problem: Education: Goal: Knowledge of disease or condition will improve Outcome: Progressing Goal: Knowledge of the prescribed therapeutic regimen will improve Outcome: Progressing   Problem: Fluid Volume: Goal: Peripheral tissue perfusion will improve Outcome: Progressing   Problem: Clinical Measurements: Goal: Complications related to disease process, condition or treatment will be avoided or minimized Outcome: Progressing   

## 2021-01-30 NOTE — Progress Notes (Signed)
Sruti Hueston is a 30 y.o. G2P0010 at 21w0dby LMP admitted for induction of labor due to Pre-eclamptic toxemia of pregnancy..  Subjective:   Objective: BP 132/90   Pulse 85   Temp 98.4 F (36.9 C) (Oral)   Resp 20   Ht '5\' 7"'$  (1.702 m)   Wt 102.7 kg   SpO2 99%   BMI 35.47 kg/m  I/O last 3 completed shifts: In: 5286.9 [P.O.:2670; I.V.:2266.9; IV Piggyback:350] Out: 6100 [Urine:6100] Total I/O In: 1234.9 [P.O.:960; I.V.:274.9] Out: 300 [Urine:300]  FHT:  FHR: 140 bpm, variability: moderate,  accelerations:  Present,  decelerations:  Absent UC:   regular, every 5 minutes SVE:   Dilation: 1.5 Effacement (%): 50 Station: -3, -2 Exam by:: ASidonie Dickens RN AROM clear IUPC placed  Labs: Lab Results  Component Value Date   WBC 10.7 (H) 01/29/2021   HGB 13.7 01/29/2021   HCT 38.8 01/29/2021   MCV 92.8 01/29/2021   PLT 216 01/29/2021    Assessment / Plan: Augmentation of labor, progressing well  Labor:  now with AROM after cytotec.  Will Pit prn Preeclampsia:  on magnesium sulfate Fetal Wellbeing:  Category I Pain Control:  Labor support without medications I/D:  n/a Anticipated MOD:  NSVD  DLuz Lex9/04/2021, 11:14 PM

## 2021-01-30 NOTE — Progress Notes (Signed)
Joyce Massey is a 30 y.o. G2P0010 at 84w0dby LMP admitted for induction of labor due to Pre-eclamptic toxemia of pregnancy..  Subjective:  Complains of band like HA better with meds.  No other PIH sxs Objective: BP 124/84   Pulse 81   Temp 97.9 F (36.6 C) (Oral)   Resp 16   Ht '5\' 7"'$  (1.702 m)   Wt 102.7 kg   SpO2 97%   BMI 35.47 kg/m  I/O last 3 completed shifts: In: 3316.5 [P.O.:1920; I.V.:1046.5; IV Piggyback:350] Out: 2500 [Urine:2500] Total I/O In: 534[P.O.:50] Out: 800 [Urine:800]  FHT:  FHR: 140 bpm, variability: moderate,  accelerations:  Present,  decelerations:  Absent UC:   none irritability SVE:   Dilation: Fingertip Effacement (%): Thick Station: -3 Exam by:: [employer] Labs: Lab Results  Component Value Date   WBC 10.7 (H) 01/29/2021   HGB 13.7 01/29/2021   HCT 38.8 01/29/2021   MCV 92.8 01/29/2021   PLT 216 01/29/2021    Assessment / Plan: Induction of labor due to preeclampsia,  progressing well on pitocin  Labor:  slow progression with cytotec vaginally Preeclampsia:  on magnesium sulfate and no signs or symptoms of toxicity Fetal Wellbeing:  Category I Pain Control:  Labor support without medications I/D:  n/a Anticipated MOD:  NSVD   Normal labs and BPs Will try flexeril for HA and decrease Mag to 1gm/h Will change to buccal cytotec DLuz Lex9/04/2021, 8:50 AM

## 2021-01-31 ENCOUNTER — Inpatient Hospital Stay (HOSPITAL_COMMUNITY): Payer: 59 | Admitting: Anesthesiology

## 2021-01-31 ENCOUNTER — Encounter (HOSPITAL_COMMUNITY)
Admission: AD | Disposition: A | Discharge status: Home / Self Care | Payer: Self-pay | Source: Home / Self Care | Attending: Obstetrics & Gynecology

## 2021-01-31 ENCOUNTER — Encounter (HOSPITAL_COMMUNITY): Payer: Self-pay | Admitting: Obstetrics and Gynecology

## 2021-01-31 LAB — CBC
HCT: 36.1 % (ref 36.0–46.0)
HCT: 36.5 % (ref 36.0–46.0)
Hemoglobin: 12.7 g/dL (ref 12.0–15.0)
Hemoglobin: 12.8 g/dL (ref 12.0–15.0)
MCH: 32.6 pg (ref 26.0–34.0)
MCH: 33.1 pg (ref 26.0–34.0)
MCHC: 34.8 g/dL (ref 30.0–36.0)
MCHC: 35.5 g/dL (ref 30.0–36.0)
MCV: 93.3 fL (ref 80.0–100.0)
MCV: 93.8 fL (ref 80.0–100.0)
Platelets: 184 10*3/uL (ref 150–400)
Platelets: 194 10*3/uL (ref 150–400)
RBC: 3.87 MIL/uL (ref 3.87–5.11)
RBC: 3.89 MIL/uL (ref 3.87–5.11)
RDW: 14.2 % (ref 11.5–15.5)
RDW: 14.3 % (ref 11.5–15.5)
WBC: 11.9 10*3/uL — ABNORMAL HIGH (ref 4.0–10.5)
WBC: 12.3 10*3/uL — ABNORMAL HIGH (ref 4.0–10.5)
nRBC: 0 % (ref 0.0–0.2)
nRBC: 0 % (ref 0.0–0.2)

## 2021-01-31 SURGERY — Surgical Case
Anesthesia: Epidural

## 2021-01-31 MED ORDER — KETOROLAC TROMETHAMINE 30 MG/ML IJ SOLN: 30.0000 mg | Freq: Four times a day (QID) | INTRAMUSCULAR | Status: DC | PRN

## 2021-01-31 MED ORDER — LIDOCAINE HCL (PF) 1 % IJ SOLN: INTRAMUSCULAR | Status: DC | PRN

## 2021-01-31 MED ORDER — OXYCODONE HCL 5 MG PO TABS
5.0000 mg | ORAL_TABLET | ORAL | Status: DC | PRN
  Administered 2021-01-31 – 2021-02-01 (×3): 10 mg via ORAL
  Filled 2021-01-31 (×3): qty 2

## 2021-01-31 MED ORDER — IBUPROFEN 800 MG PO TABS
800.0000 mg | ORAL_TABLET | Freq: Four times a day (QID) | ORAL | Status: DC
  Administered 2021-02-01: 800 mg via ORAL
  Filled 2021-01-31: qty 1

## 2021-01-31 MED ORDER — NALBUPHINE HCL 10 MG/ML IJ SOLN: 5.0000 mg | Freq: Once | INTRAMUSCULAR | Status: DC | PRN

## 2021-01-31 MED ORDER — MEPERIDINE HCL 25 MG/ML IJ SOLN: 6.2500 mg | INTRAMUSCULAR | Status: DC | PRN

## 2021-01-31 MED ORDER — PROMETHAZINE HCL 25 MG/ML IJ SOLN: 6.2500 mg | INTRAMUSCULAR | Status: DC | PRN

## 2021-01-31 MED ORDER — LIDOCAINE HCL 1 % IJ SOLN
INTRAMUSCULAR | Status: AC
  Filled 2021-01-31: qty 20

## 2021-01-31 MED ORDER — ACETAMINOPHEN 500 MG PO TABS: 1000.0000 mg | ORAL_TABLET | Freq: Four times a day (QID) | ORAL | Status: DC

## 2021-01-31 MED ORDER — MENTHOL 3 MG MT LOZG: 1.0000 | LOZENGE | OROMUCOSAL | Status: DC | PRN

## 2021-01-31 MED ORDER — OXYTOCIN-SODIUM CHLORIDE 30-0.9 UT/500ML-% IV SOLN
INTRAVENOUS | Status: AC
  Filled 2021-01-31: qty 500

## 2021-01-31 MED ORDER — SIMETHICONE 80 MG PO CHEW: 80.0000 mg | CHEWABLE_TABLET | ORAL | Status: DC | PRN

## 2021-01-31 MED ORDER — SENNOSIDES-DOCUSATE SODIUM 8.6-50 MG PO TABS
2.0000 | ORAL_TABLET | Freq: Every day | ORAL | Status: DC
  Administered 2021-02-01 – 2021-02-02 (×2): 2 via ORAL
  Filled 2021-01-31 (×2): qty 2

## 2021-01-31 MED ORDER — FENTANYL CITRATE (PF) 100 MCG/2ML IJ SOLN
INTRAMUSCULAR | Status: AC
  Filled 2021-01-31: qty 2

## 2021-01-31 MED ORDER — SODIUM CHLORIDE 0.9 % IR SOLN
Status: DC | PRN
  Administered 2021-01-31: 1

## 2021-01-31 MED ORDER — FENTANYL CITRATE (PF) 100 MCG/2ML IJ SOLN
100.0000 ug | INTRAMUSCULAR | Status: DC | PRN
  Administered 2021-01-31 (×4): 100 ug via INTRAVENOUS
  Filled 2021-01-31 (×4): qty 2

## 2021-01-31 MED ORDER — KETOROLAC TROMETHAMINE 30 MG/ML IJ SOLN
30.0000 mg | Freq: Four times a day (QID) | INTRAMUSCULAR | Status: DC | PRN
  Administered 2021-01-31: 30 mg via INTRAVENOUS

## 2021-01-31 MED ORDER — PROPOFOL 10 MG/ML IV BOLUS
INTRAVENOUS | Status: DC | PRN
  Administered 2021-01-31: 200 mg via INTRAVENOUS

## 2021-01-31 MED ORDER — OXYCODONE HCL 5 MG/5ML PO SOLN
5.0000 mg | Freq: Once | ORAL | Status: DC | PRN
Start: 2021-01-31 — End: 2021-01-31

## 2021-01-31 MED ORDER — ONDANSETRON HCL 4 MG/2ML IJ SOLN
4.0000 mg | Freq: Three times a day (TID) | INTRAMUSCULAR | Status: DC | PRN
  Administered 2021-01-31: 4 mg via INTRAVENOUS
  Filled 2021-01-31: qty 2

## 2021-01-31 MED ORDER — SUCCINYLCHOLINE CHLORIDE 200 MG/10ML IV SOSY
PREFILLED_SYRINGE | INTRAVENOUS | Status: DC | PRN
  Administered 2021-01-31: 100 mg via INTRAVENOUS

## 2021-01-31 MED ORDER — DEXTROSE 5 % IV SOLN
1.0000 ug/kg/h | INTRAVENOUS | Status: DC | PRN
  Filled 2021-01-31: qty 5

## 2021-01-31 MED ORDER — ONDANSETRON HCL 4 MG/2ML IJ SOLN
INTRAMUSCULAR | Status: AC
  Filled 2021-01-31: qty 2

## 2021-01-31 MED ORDER — DIPHENHYDRAMINE HCL 25 MG PO CAPS: 25.0000 mg | ORAL_CAPSULE | ORAL | Status: DC | PRN

## 2021-01-31 MED ORDER — ZOLPIDEM TARTRATE 5 MG PO TABS
5.0000 mg | ORAL_TABLET | Freq: Every evening | ORAL | Status: DC | PRN
  Administered 2021-02-02: 5 mg via ORAL
  Filled 2021-01-31: qty 1

## 2021-01-31 MED ORDER — KETOROLAC TROMETHAMINE 30 MG/ML IJ SOLN
INTRAMUSCULAR | Status: AC
  Filled 2021-01-31: qty 1

## 2021-01-31 MED ORDER — OXYTOCIN-SODIUM CHLORIDE 30-0.9 UT/500ML-% IV SOLN: 2.5000 [IU]/h | INTRAVENOUS | Status: AC

## 2021-01-31 MED ORDER — MIDAZOLAM HCL 2 MG/2ML IJ SOLN
INTRAMUSCULAR | Status: AC
  Filled 2021-01-31: qty 2

## 2021-01-31 MED ORDER — AMISULPRIDE (ANTIEMETIC) 5 MG/2ML IV SOLN: 10.0000 mg | Freq: Once | INTRAVENOUS | Status: DC | PRN

## 2021-01-31 MED ORDER — SODIUM CHLORIDE 0.9% FLUSH: 3.0000 mL | INTRAVENOUS | Status: DC | PRN

## 2021-01-31 MED ORDER — CEFAZOLIN SODIUM-DEXTROSE 2-4 GM/100ML-% IV SOLN
2.0000 g | Freq: Once | INTRAVENOUS | Status: AC
  Administered 2021-01-31: 2 g via INTRAVENOUS

## 2021-01-31 MED ORDER — EPHEDRINE 5 MG/ML INJ: 10.0000 mg | INTRAVENOUS | Status: DC | PRN

## 2021-01-31 MED ORDER — DIPHENHYDRAMINE HCL 50 MG/ML IJ SOLN
12.5000 mg | INTRAMUSCULAR | Status: DC | PRN
Start: 2021-01-31 — End: 2021-01-31

## 2021-01-31 MED ORDER — HYDROMORPHONE HCL 1 MG/ML IJ SOLN
INTRAMUSCULAR | Status: AC
  Filled 2021-01-31: qty 1

## 2021-01-31 MED ORDER — NALBUPHINE HCL 10 MG/ML IJ SOLN: 5.0000 mg | INTRAMUSCULAR | Status: DC | PRN

## 2021-01-31 MED ORDER — HYDROMORPHONE HCL 1 MG/ML IJ SOLN
0.2500 mg | INTRAMUSCULAR | Status: DC | PRN
  Administered 2021-01-31 (×2): 0.5 mg via INTRAVENOUS
  Administered 2021-01-31: 0.25 mg via INTRAVENOUS

## 2021-01-31 MED ORDER — MIDAZOLAM HCL 2 MG/2ML IJ SOLN
INTRAMUSCULAR | Status: DC | PRN
  Administered 2021-01-31: 2 mg via INTRAVENOUS

## 2021-01-31 MED ORDER — COCONUT OIL OIL
1.0000 "application " | TOPICAL_OIL | Status: DC | PRN
  Administered 2021-01-31: 1 via TOPICAL

## 2021-01-31 MED ORDER — DIPHENHYDRAMINE HCL 25 MG PO CAPS: 25.0000 mg | ORAL_CAPSULE | Freq: Four times a day (QID) | ORAL | Status: DC | PRN

## 2021-01-31 MED ORDER — OXYTOCIN-SODIUM CHLORIDE 30-0.9 UT/500ML-% IV SOLN
INTRAVENOUS | Status: DC | PRN
  Administered 2021-01-31: 500 mL via INTRAVENOUS

## 2021-01-31 MED ORDER — HYDROMORPHONE HCL 1 MG/ML IJ SOLN
INTRAMUSCULAR | Status: AC
  Filled 2021-01-31: qty 0.5

## 2021-01-31 MED ORDER — HYDROMORPHONE HCL 1 MG/ML IJ SOLN
INTRAMUSCULAR | Status: DC | PRN
  Administered 2021-01-31: 1 mg via INTRAVENOUS

## 2021-01-31 MED ORDER — FENTANYL-BUPIVACAINE-NACL 0.5-0.125-0.9 MG/250ML-% EP SOLN
12.0000 mL/h | EPIDURAL | Status: DC | PRN
  Filled 2021-01-31: qty 250

## 2021-01-31 MED ORDER — ACETAMINOPHEN 500 MG PO TABS
1000.0000 mg | ORAL_TABLET | Freq: Four times a day (QID) | ORAL | Status: DC
  Administered 2021-02-01 – 2021-02-02 (×7): 1000 mg via ORAL
  Filled 2021-01-31 (×7): qty 2

## 2021-01-31 MED ORDER — MAGNESIUM SULFATE 40 GM/1000ML IV SOLN: 1.0000 g/h | INTRAVENOUS | Status: AC

## 2021-01-31 MED ORDER — CEFAZOLIN SODIUM-DEXTROSE 2-4 GM/100ML-% IV SOLN
INTRAVENOUS | Status: AC
  Filled 2021-01-31: qty 100

## 2021-01-31 MED ORDER — DEXAMETHASONE SODIUM PHOSPHATE 4 MG/ML IJ SOLN
INTRAMUSCULAR | Status: DC | PRN
  Administered 2021-01-31: 4 mg via INTRAVENOUS

## 2021-01-31 MED ORDER — PHENYLEPHRINE 40 MCG/ML (10ML) SYRINGE FOR IV PUSH (FOR BLOOD PRESSURE SUPPORT)
80.0000 ug | PREFILLED_SYRINGE | INTRAVENOUS | Status: DC | PRN
  Filled 2021-01-31: qty 10

## 2021-01-31 MED ORDER — FENTANYL CITRATE (PF) 100 MCG/2ML IJ SOLN
INTRAMUSCULAR | Status: DC | PRN
  Administered 2021-01-31: 200 ug via INTRAVENOUS
  Administered 2021-01-31 (×2): 50 ug via INTRAVENOUS

## 2021-01-31 MED ORDER — DIPHENHYDRAMINE HCL 50 MG/ML IJ SOLN: 12.5000 mg | INTRAMUSCULAR | Status: DC | PRN

## 2021-01-31 MED ORDER — ACETAMINOPHEN 10 MG/ML IV SOLN
INTRAVENOUS | Status: DC | PRN
  Administered 2021-01-31: 1000 mg via INTRAVENOUS

## 2021-01-31 MED ORDER — TETANUS-DIPHTH-ACELL PERTUSSIS 5-2.5-18.5 LF-MCG/0.5 IM SUSY: 0.5000 mL | PREFILLED_SYRINGE | Freq: Once | INTRAMUSCULAR | Status: DC

## 2021-01-31 MED ORDER — ACETAMINOPHEN 10 MG/ML IV SOLN
INTRAVENOUS | Status: AC
  Filled 2021-01-31: qty 100

## 2021-01-31 MED ORDER — ONDANSETRON HCL 4 MG/2ML IJ SOLN
INTRAMUSCULAR | Status: DC | PRN
  Administered 2021-01-31: 4 mg via INTRAVENOUS

## 2021-01-31 MED ORDER — FENTANYL CITRATE (PF) 100 MCG/2ML IJ SOLN
25.0000 ug | INTRAMUSCULAR | Status: DC | PRN
  Administered 2021-01-31 (×2): 50 ug via INTRAVENOUS
  Administered 2021-01-31 (×2): 25 ug via INTRAVENOUS

## 2021-01-31 MED ORDER — STERILE WATER FOR IRRIGATION IR SOLN
Status: DC | PRN
  Administered 2021-01-31: 1

## 2021-01-31 MED ORDER — FENTANYL CITRATE (PF) 250 MCG/5ML IJ SOLN
INTRAMUSCULAR | Status: AC
  Filled 2021-01-31: qty 5

## 2021-01-31 MED ORDER — OXYCODONE HCL 5 MG PO TABS
5.0000 mg | ORAL_TABLET | Freq: Once | ORAL | Status: DC | PRN
Start: 2021-01-31 — End: 2021-01-31

## 2021-01-31 MED ORDER — BUPIVACAINE HCL (PF) 0.25 % IJ SOLN
INTRAMUSCULAR | Status: DC | PRN
  Administered 2021-01-31 (×2): 4 mL via EPIDURAL

## 2021-01-31 MED ORDER — LACTATED RINGERS IV SOLN
500.0000 mL | Freq: Once | INTRAVENOUS | Status: AC
  Administered 2021-01-31: 500 mL via INTRAVENOUS

## 2021-01-31 MED ORDER — PHENYLEPHRINE 40 MCG/ML (10ML) SYRINGE FOR IV PUSH (FOR BLOOD PRESSURE SUPPORT): 80.0000 ug | PREFILLED_SYRINGE | INTRAVENOUS | Status: DC | PRN

## 2021-01-31 MED ORDER — NALOXONE HCL 0.4 MG/ML IJ SOLN: 0.4000 mg | INTRAMUSCULAR | Status: DC | PRN

## 2021-01-31 MED ORDER — WITCH HAZEL-GLYCERIN EX PADS: 1.0000 "application " | MEDICATED_PAD | CUTANEOUS | Status: DC | PRN

## 2021-01-31 MED ORDER — SIMETHICONE 80 MG PO CHEW
80.0000 mg | CHEWABLE_TABLET | Freq: Three times a day (TID) | ORAL | Status: DC
  Administered 2021-02-01 – 2021-02-02 (×5): 80 mg via ORAL
  Filled 2021-01-31 (×5): qty 1

## 2021-01-31 MED ORDER — DIBUCAINE (PERIANAL) 1 % EX OINT: 1.0000 "application " | TOPICAL_OINTMENT | CUTANEOUS | Status: DC | PRN

## 2021-01-31 MED ORDER — PRENATAL MULTIVITAMIN CH
1.0000 | ORAL_TABLET | Freq: Every day | ORAL | Status: DC
  Filled 2021-01-31 (×2): qty 1

## 2021-01-31 MED ORDER — PHENYLEPHRINE HCL-NACL 20-0.9 MG/250ML-% IV SOLN
INTRAVENOUS | Status: DC | PRN
  Administered 2021-01-31: 60 ug/min via INTRAVENOUS

## 2021-01-31 MED ORDER — MORPHINE SULFATE (PF) 0.5 MG/ML IJ SOLN
INTRAMUSCULAR | Status: AC
  Filled 2021-01-31: qty 10

## 2021-01-31 MED ORDER — LACTATED RINGERS IV SOLN: INTRAVENOUS | Status: DC

## 2021-01-31 SURGICAL SUPPLY — 36 items
BENZOIN TINCTURE PRP APPL 2/3 (GAUZE/BANDAGES/DRESSINGS) ×2 IMPLANT
CHLORAPREP W/TINT 26ML (MISCELLANEOUS) ×2 IMPLANT
CLAMP CORD UMBIL (MISCELLANEOUS) IMPLANT
CLOTH BEACON ORANGE TIMEOUT ST (SAFETY) ×2 IMPLANT
DERMABOND ADVANCED (GAUZE/BANDAGES/DRESSINGS)
DERMABOND ADVANCED .7 DNX12 (GAUZE/BANDAGES/DRESSINGS) IMPLANT
DRSG OPSITE POSTOP 4X10 (GAUZE/BANDAGES/DRESSINGS) ×2 IMPLANT
DRSG PAD ABDOMINAL 8X10 ST (GAUZE/BANDAGES/DRESSINGS) ×2 IMPLANT
ELECT REM PT RETURN 9FT ADLT (ELECTROSURGICAL) ×2
ELECTRODE REM PT RTRN 9FT ADLT (ELECTROSURGICAL) ×1 IMPLANT
EXTRACTOR VACUUM KIWI (MISCELLANEOUS) ×2 IMPLANT
GAUZE SPONGE 4X4 12PLY STRL LF (GAUZE/BANDAGES/DRESSINGS) ×4 IMPLANT
GLOVE BIO SURGEON STRL SZ 6 (GLOVE) ×2 IMPLANT
GLOVE BIOGEL PI IND STRL 6 (GLOVE) ×2 IMPLANT
GLOVE BIOGEL PI IND STRL 7.0 (GLOVE) ×1 IMPLANT
GLOVE BIOGEL PI INDICATOR 6 (GLOVE) ×2
GLOVE BIOGEL PI INDICATOR 7.0 (GLOVE) ×1
GOWN STRL REUS W/TWL LRG LVL3 (GOWN DISPOSABLE) ×4 IMPLANT
KIT ABG SYR 3ML LUER SLIP (SYRINGE) ×2 IMPLANT
NEEDLE HYPO 25X5/8 SAFETYGLIDE (NEEDLE) ×2 IMPLANT
NS IRRIG 1000ML POUR BTL (IV SOLUTION) ×2 IMPLANT
PACK C SECTION WH (CUSTOM PROCEDURE TRAY) ×2 IMPLANT
PAD OB MATERNITY 4.3X12.25 (PERSONAL CARE ITEMS) ×2 IMPLANT
PENCIL SMOKE EVAC W/HOLSTER (ELECTROSURGICAL) ×2 IMPLANT
STRIP CLOSURE SKIN 1/2X4 (GAUZE/BANDAGES/DRESSINGS) ×2 IMPLANT
SUT CHROMIC 0 CTX 36 (SUTURE) ×8 IMPLANT
SUT MON AB 2-0 CT1 27 (SUTURE) ×2 IMPLANT
SUT PDS AB 0 CT1 27 (SUTURE) IMPLANT
SUT PLAIN 0 NONE (SUTURE) IMPLANT
SUT PLAIN 2 0 (SUTURE) ×1
SUT PLAIN ABS 2-0 CT1 27XMFL (SUTURE) ×1 IMPLANT
SUT VIC AB 0 CT1 36 (SUTURE) IMPLANT
SUT VIC AB 4-0 KS 27 (SUTURE) IMPLANT
TOWEL OR 17X24 6PK STRL BLUE (TOWEL DISPOSABLE) ×2 IMPLANT
TRAY FOLEY W/BAG SLVR 14FR LF (SET/KITS/TRAYS/PACK) IMPLANT
WATER STERILE IRR 1000ML POUR (IV SOLUTION) ×2 IMPLANT

## 2021-01-31 NOTE — Anesthesia Preprocedure Evaluation (Addendum)
Anesthesia Evaluation  Patient identified by MRN, date of birth, ID band Patient awake    Reviewed: Allergy & Precautions, NPO status , Patient's Chart, lab work & pertinent test results  Airway Mallampati: II  TM Distance: >3 FB Neck ROM: Full    Dental no notable dental hx.    Pulmonary neg pulmonary ROS, former smoker,    Pulmonary exam normal breath sounds clear to auscultation       Cardiovascular negative cardio ROS Normal cardiovascular exam Rhythm:Regular Rate:Normal     Neuro/Psych PSYCHIATRIC DISORDERS Anxiety H/o tailbone/lower back fractures requiring steroid and SI joint injections    GI/Hepatic Neg liver ROS, GERD  ,  Endo/Other  negative endocrine ROS  Renal/GU negative Renal ROS  negative genitourinary   Musculoskeletal negative musculoskeletal ROS (+)   Abdominal   Peds negative pediatric ROS (+)  Hematology negative hematology ROS (+)   Anesthesia Other Findings   Reproductive/Obstetrics negative OB ROS                             Anesthesia Physical Anesthesia Plan  ASA: 2  Anesthesia Plan: Epidural   Post-op Pain Management:    Induction:   PONV Risk Score and Plan: 3 and Treatment may vary due to age or medical condition  Airway Management Planned:   Additional Equipment:   Intra-op Plan:   Post-operative Plan:   Informed Consent: I have reviewed the patients History and Physical, chart, labs and discussed the procedure including the risks, benefits and alternatives for the proposed anesthesia with the patient or authorized representative who has indicated his/her understanding and acceptance.       Plan Discussed with: Anesthesiologist  Anesthesia Plan Comments: (Patient having a "hot spot" in right groin that has been refractory to boluses. C-section has been called by Dr. Lynnette Caffey. Will plan to pull epidural and place a spinal. GETA as backup  plan. Discussed all options with patient and she agrees to proceed with this plan. Norton Blizzard, MD  )       Anesthesia Quick Evaluation

## 2021-01-31 NOTE — Progress Notes (Signed)
SVE 2/50/-2 and patient has poor pain control s/p CLEA.   Patient has received 6 doses of cytotec, is s/p AROM at 2245 last night with pitocin now at 48.  Patient has had no cervical change in 8 hours.  I recommended changing out poorly functioning IUPC and monitoring for 2 hours to see if adequate CTX and patient started crying uncontrollably stating she just wants a C/S.  Patient declines continued efforts at accomplishing SVD.  Will proceed with C/S for failed IOL.  Patient is counseled re: risk of bleeding, infection, scarring and damage to surrounding structures.  She is informed of steps of procedure and postop expectations.  She is informed of risk of uterine rupture and abnormal placentation in future pregnancies.  She is counseled that we will continue magnesium x 24 hours postop for seizure prophylaxis.  All questions were answered and we will proceed.    Linda Hedges, OD

## 2021-01-31 NOTE — Lactation Note (Signed)
This note was copied from a baby's chart. Lactation Consultation Note  Patient Name: Joyce Massey Today's Date: 01/31/2021 Reason for consult: L&D Initial assessment;Mother's request;Difficult latch Age:30 years  LC talked with RN, Mom compressible tissue making hard infant to latch even in biologic nurturing position. Mom given breast shells to wear and will pre pump 5-10 min before latching.   Plan 1. To feed based on cues 8-12x 24 hr period.  2. Mom to offer breast first and look for signs of milk transfer.  3. If unable to get infant to latch, Mom to offer EBM via spoon. BF supplementation guide provided.  4. DEBP q 3 hrs for 15 min  5 I and O sheet provided.  6. [place] brochure of inpatient and outpatient services reviewed.  All questions answered at the end of the visit.   Maternal Data Has patient been taught Hand Expression?: Yes Does the patient have breastfeeding experience prior to this delivery?: No  Feeding Mother's Current Feeding Choice: Breast Milk  LATCH Score                    Lactation Tools Discussed/Used Tools: Pump;Flanges;Shells Flange Size: 27 Breast pump type: Double-Electric Breast Pump Pump Education: Setup, frequency, and cleaning;Milk Storage Reason for Pumping: increase stimulation Pumping frequency: every 3 hrs for 15 min  Interventions Interventions: Breast feeding basics reviewed;Breast compression;Assisted with latch;Skin to skin;Support pillows;Adjust position;Breast massage;Position options;Hand express;Expressed milk;Education;Pre-pump if needed;DEBP  Discharge Pump: Personal  Consult Status Consult Status: Follow-up Date: 02/01/21 Follow-up type: In-patient    Joyce Massey  Nicholson[place]Springer 01/31/2021, 8:50 PM

## 2021-01-31 NOTE — Progress Notes (Signed)
Joyce Massey is a 30 y.o. G2P0010 at 51w1dby ultrasound admitted for induction of labor due to Pre-eclamptic toxemia of pregnancy..  Subjective: No HA, vision change, RUQ pain, CP/SOB.  Taking fentanyl prn pain.    Objective: BP 111/64   Pulse 71   Temp 98.4 F (36.9 C) (Oral)   Resp 20   Ht '5\' 7"'$  (1.702 m)   Wt 102.7 kg   SpO2 99%   BMI 35.47 kg/m  I/O last 3 completed shifts: In: 9491.7 [P.O.:5670; I.V.:3348.3; IV Piggyback:473.3] Out: 8400 [Urine:8400] No intake/output data recorded.  FHT:  FHR: 130 bpm, variability: moderate,  accelerations:  Present,  decelerations:  Absent UC:   regular, every 2 minutes SVE:   Dilation: 2 Effacement (%): 60, 70 Station: -3 Exam by:: ASidonie Dickens RN  Labs: Lab Results  Component Value Date   WBC 11.9 (H) 01/31/2021   HGB 12.8 01/31/2021   HCT 36.1 01/31/2021   MCV 93.3 01/31/2021   PLT 194 01/31/2021    Assessment / Plan: Induction of labor for pre-eclampsia with severe features  Labor:  s/p AROM and on pitocin with adequate MVUs; plan to recheck around 1030 Preeclampsia:  on magnesium sulfate and no signs or symptoms of toxicity; decreased to 1 g/hr yesterday Fetal Wellbeing:  Category I Pain Control:  IV pain meds; considering CLEA.  Will recheck labs I/D:  n/a Anticipated MOD:  NSVD  MLinda Hedges9/13/2022, 8:02 AM

## 2021-01-31 NOTE — Anesthesia Procedure Notes (Signed)
Procedure Name: Intubation Date/Time: 01/31/2021 3:42 PM Performed by: Elenore Paddy, CRNA Pre-anesthesia Checklist: Patient identified, Patient being monitored, Timeout performed, Emergency Drugs available and Suction available Patient Re-evaluated:Patient Re-evaluated prior to induction Oxygen Delivery Method: Circle System Utilized Preoxygenation: Pre-oxygenation with 100% oxygen Induction Type: IV induction Ventilation: Mask ventilation without difficulty Laryngoscope Size: 3 and Glidescope Grade View: Grade II Tube type: Oral Tube size: 7.0 mm Number of attempts: 1 Airway Equipment and Method: stylet and Stylet Placement Confirmation: ETT inserted through vocal cords under direct vision, positive ETCO2 and breath sounds checked- equal and bilateral Secured at: 21 cm Tube secured with: Tape Dental Injury: Teeth and Oropharynx as per pre-operative assessment  Comments: Intubated by [facility]

## 2021-01-31 NOTE — Op Note (Signed)
Joyce Massey PROCEDURE DATE: 01/31/2021  PREOPERATIVE DIAGNOSIS: Intrauterine pregnancy at  57w1dweeks gestation, pre-eclampsia with severe features, failed induction of labor  POSTOPERATIVE DIAGNOSIS: The same  PROCEDURE:  Primary Low Transverse Cesarean Section  SURGEON:  Dr. MLinda Hedges INDICATIONS: Joyce Massey a 30y.o. G2P0010 at 31w1dcheduled for cesarean section secondary to failed induction of labor for pre-eclampsia with severe features.  The risks of cesarean section discussed with the patient included but were not limited to: bleeding which may require transfusion or reoperation; infection which may require antibiotics; injury to bowel, bladder, ureters or other surrounding organs; injury to the fetus; need for additional procedures including hysterectomy in the event of a life-threatening hemorrhage; placental abnormalities wth subsequent pregnancies, incisional problems, thromboembolic phenomenon and other postoperative/anesthesia complications. The patient concurred with the proposed plan, giving informed written consent for the procedure.    FINDINGS:  Viable female infant in cephalic presentation, APGARs 6,8,9: weight pending  Clear amniotic fluid.  Intact placenta, three vessel cord.  Grossly normal uterus, ovaries and fallopian tubes. .   ANESTHESIA:  GETA ESTIMATED BLOOD LOSS: 600 ml SPECIMENS: Placenta sent to L&D COMPLICATIONS: None immediate  PROCEDURE IN DETAIL:  The patient received intravenous antibiotics and had sequential compression devices applied to her lower extremities while in the preoperative area.  She was then taken to the operating room where spinal anesthesia was attempted but anesthesia was unable to place secondary to patient discomfort. The decision was made to proceed with general anesthesia.  She was then placed in a dorsal supine position with a leftward tilt, and prepped and draped in a sterile manner.  After an adequate timeout was performed,  general anesthesia was induced.  A Pfannenstiel skin incision was made with scalpel and carried through to the underlying layer of fascia. The fascia was incised in the midline and this incision was extended bilaterally using the Mayo scissors. Kocher clamps were applied to the superior aspect of the fascial incision and the underlying rectus muscles were dissected off bluntly. A similar process was carried out on the inferior aspect of the facial incision. The rectus muscles were separated in the midline bluntly and the peritoneum was entered bluntly.  Bladder flap was created sharply and developed bluntly.  Bladder blade was placed. A transverse hysterotomy was made with a scalpel and extended bilaterally bluntly. The bladder blade was then removed. The infant was successfully delivered using a single Kiwi vacuum pull, and cord was clamped and cut and infant was handed over to awaiting neonatology team. Uterine massage was then administered and the placenta delivered intact with three-vessel cord. The uterus was cleared of clot and debris.  The hysterotomy was closed with 0 chromic.  A second imbricating suture of 0-chromic was used to reinforce the incision and aid in hemostasis.  The peritoneum and rectus muscles were noted to be hemostatic and were reapproximated using 2-0 monocryl in a running fashion.  The fascia was closed with 0-Vicryl in a running fashion with good restoration of anatomy.  The subcutaneus tissue was copiously irrigated and was reapproximated using 3 interrupted plain gut stitches.  The skin was closed with 4-0 vicryl in a subcuticular fashion.  Pt tolerated the procedure will.  All counts were correct x2.  Pt went to the recovery room in stable condition.

## 2021-01-31 NOTE — Progress Notes (Signed)
Sonnie Parkhill is a 30 y.o. G2P0010 at 11w1dby ultrasound admitted for induction of labor due to Pre-eclamptic toxemia of pregnancy..  Subjective: More comfortable with CLEA.  No HA, vision change, RUQ pain, CP/SOB.  Objective: BP (!) 139/95   Pulse 87   Temp 98.4 F (36.9 C) (Oral)   Resp 20   Ht '5\' 7"'$  (1.702 m)   Wt 102.7 kg   SpO2 100%   BMI 35.47 kg/m  I/O last 3 completed shifts: In: 9531.8 [P.O.:5670; I.V.:3388.5; IV Piggyback:473.3] Out: 8400 [Urine:8400] Total I/O In: 2834.5 [P.O.:660; I.V.:999.5; Other:1175] Out: 950 [Urine:950]  FHT:  FHR: 130 bpm, variability: moderate,  accelerations:  Present,  decelerations:  Absent UC:   regular, every 2 minutes SVE:   Dilation: 2 Effacement (%): 50 Station: -3 Exam by:: CLenox Ponds RN HNilsa Nutting RN  Labs: Lab Results  Component Value Date   WBC 12.3 (H) 01/31/2021   HGB 12.7 01/31/2021   HCT 36.5 01/31/2021   MCV 93.8 01/31/2021   PLT 184 01/31/2021    Assessment / Plan: Induction of labor for pre-eclampsia with severe features  Labor:  Continue pitocin.  Plan cervix recheck around 230 and if unchanged, will replace IUPC given not tracing well despite flushing and changing out cord Preeclampsia:  on magnesium sulfate and no signs or symptoms of toxicity Fetal Wellbeing:  Category I Pain Control:  Epidural I/D:  n/a Anticipated MOD:  NSVD  MLinda Hedges9/13/2022, 1:45 PM

## 2021-01-31 NOTE — Transfer of Care (Signed)
Immediate Anesthesia Transfer of Care Note  Patient: Joyce Massey  Procedure(s) Performed: CESAREAN SECTION  Patient Location: PACU  Anesthesia Type:General  Level of Consciousness: awake, alert  and oriented  Airway & Oxygen Therapy: Patient Spontanous Breathing and Patient connected to nasal cannula oxygen  Post-op Assessment: Report given to RN and Post -op Vital signs reviewed and stable  Post vital signs: Reviewed and stable  Last Vitals:  Vitals Value Taken Time  BP 138/89 01/31/21 1648  Temp    Pulse 80 01/31/21 1654  Resp 11 01/31/21 1654  SpO2 98 % 01/31/21 1654  Vitals shown include unvalidated device data.  Last Pain:  Vitals:   01/31/21 1327  TempSrc:   PainSc: 5       Patients Stated Pain Goal: 0 ([id number])  Complications: No notable events documented.

## 2021-01-31 NOTE — Anesthesia Procedure Notes (Addendum)
Epidural Patient location during procedure: OB Start time: 01/31/2021 9:35 AM End time: 01/31/2021 9:50 AM  Staffing Anesthesiologist: [name], MD Performed: anesthesiologist   Preanesthetic Checklist Completed: patient identified, IV checked, site marked, risks and benefits discussed, monitors and equipment checked, pre-op evaluation and timeout performed  Epidural Patient position: sitting Prep: DuraPrep Patient monitoring: heart rate, cardiac monitor, continuous pulse ox and blood pressure Approach: midline Location: L2-L3 Injection technique: LOR saline  Needle:  Needle type: Tuohy  Needle gauge: 18 G Needle length: 9 cm Needle insertion depth: 8 cm Catheter type: closed end Catheter size: 20 Guage Catheter at skin depth: 13 cm Test dose: negative and Other  Assessment Events: blood not aspirated, injection not painful, no injection resistance and negative IV test  Additional Notes Informed consent obtained prior to proceeding including risk of failure, 1% risk of PDPH, risk of minor discomfort and bruising.  Discussed rare but serious complications including epidural abscess, permanent nerve injury, epidural hematoma.  Discussed alternatives to epidural analgesia and patient desires to proceed.  Timeout performed pre-procedure verifying patient name, procedure, and platelet count.  Patient tolerated procedure well despite very challenging epidural placement due to patient's clinical history.

## 2021-02-01 ENCOUNTER — Encounter (HOSPITAL_COMMUNITY): Payer: Self-pay | Admitting: Obstetrics & Gynecology

## 2021-02-01 ENCOUNTER — Inpatient Hospital Stay (HOSPITAL_COMMUNITY): Payer: 59

## 2021-02-01 LAB — COMPREHENSIVE METABOLIC PANEL
ALT: 17 U/L (ref 0–44)
AST: 43 U/L — ABNORMAL HIGH (ref 15–41)
Albumin: 2.1 g/dL — ABNORMAL LOW (ref 3.5–5.0)
Alkaline Phosphatase: 116 U/L (ref 38–126)
Anion gap: 11 (ref 5–15)
BUN: 7 mg/dL (ref 6–20)
CO2: 22 mmol/L (ref 22–32)
Calcium: 8.1 mg/dL — ABNORMAL LOW (ref 8.9–10.3)
Chloride: 98 mmol/L (ref 98–111)
Creatinine, Ser: 0.68 mg/dL (ref 0.44–1.00)
GFR, Estimated: >60 mL/min (ref >60)
Glucose, Bld: 133 mg/dL — ABNORMAL HIGH (ref 70–99)
Potassium: 4.2 mmol/L (ref 3.5–5.1)
Sodium: 131 mmol/L — ABNORMAL LOW (ref 135–145)
Total Bilirubin: 0.4 mg/dL (ref 0.3–1.2)
Total Protein: 4.7 g/dL — ABNORMAL LOW (ref 6.5–8.1)

## 2021-02-01 LAB — CBC
HCT: 29.9 % — ABNORMAL LOW (ref 36.0–46.0)
Hemoglobin: 10.7 g/dL — ABNORMAL LOW (ref 12.0–15.0)
MCH: 33.4 pg (ref 26.0–34.0)
MCHC: 35.8 g/dL (ref 30.0–36.0)
MCV: 93.4 fL (ref 80.0–100.0)
Platelets: 160 10*3/uL (ref 150–400)
RBC: 3.2 MIL/uL — ABNORMAL LOW (ref 3.87–5.11)
RDW: 14.3 % (ref 11.5–15.5)
WBC: 11.7 10*3/uL — ABNORMAL HIGH (ref 4.0–10.5)
nRBC: 0 % (ref 0.0–0.2)

## 2021-02-01 MED ORDER — HYDROMORPHONE HCL 2 MG PO TABS
2.0000 mg | ORAL_TABLET | ORAL | Status: DC | PRN
  Administered 2021-02-01 (×2): 2 mg via ORAL
  Filled 2021-02-01 (×2): qty 1

## 2021-02-01 MED ORDER — HYDROMORPHONE HCL 2 MG PO TABS
2.0000 mg | ORAL_TABLET | Freq: Once | ORAL | Status: AC
Start: 2021-02-01 — End: 2021-02-01
  Administered 2021-02-01: 2 mg via ORAL
  Filled 2021-02-01: qty 1

## 2021-02-01 MED ORDER — HYDROMORPHONE HCL 2 MG PO TABS
4.0000 mg | ORAL_TABLET | ORAL | Status: DC | PRN
  Administered 2021-02-01 – 2021-02-02 (×6): 4 mg via ORAL
  Filled 2021-02-01 (×6): qty 2

## 2021-02-01 MED ORDER — IBUPROFEN 600 MG PO TABS
600.0000 mg | ORAL_TABLET | Freq: Four times a day (QID) | ORAL | Status: DC | PRN
  Administered 2021-02-01 – 2021-02-02 (×3): 600 mg via ORAL
  Filled 2021-02-01 (×3): qty 1

## 2021-02-01 MED ORDER — INFLUENZA VAC SPLIT QUAD 0.5 ML IM SUSY
0.5000 mL | PREFILLED_SYRINGE | Freq: Once | INTRAMUSCULAR | Status: AC
  Administered 2021-02-01: 0.5 mL via INTRAMUSCULAR
  Filled 2021-02-01: qty 0.5

## 2021-02-01 MED ORDER — KETOROLAC TROMETHAMINE 30 MG/ML IJ SOLN
30.0000 mg | Freq: Four times a day (QID) | INTRAMUSCULAR | Status: DC | PRN
  Administered 2021-02-01 (×3): 30 mg via INTRAVENOUS
  Filled 2021-02-01 (×3): qty 1

## 2021-02-01 NOTE — Lactation Note (Signed)
This note was copied from a baby's chart. Lactation Consultation Note  Patient Name: Joyce Massey Today's Date: 02/01/2021 Reason for consult: Follow-up assessment;Early term 37-38.6wks;Primapara;1st time breastfeeding Age:31 hours   P1 mother whose infant is now 71 hours old.  This is an ETI at 37+1 weeks.  Mother's current feeding preference is breast/donor milk.  Mother has been breast feeding and has now recently started supplementing with donor breast milk.  Volume guidelines reviewed and informed parents that baby is able to consume more than the minimum guidelines as desired.  Suggested she attempt breast feeding prior to donor milk supplementation.  Mother will post pump for 15 minutes and feed back any EBM she obtains to baby.  Provided the purple extra slow flow nipple and demonstrated paced bottle feeding since it was time to feed again.  Baby was hungry and easily consumed the donor milk (amounts charted by RN)  Suggested mother call for latch assistance as needed.  Breast shells at bedside and assisted mother with placing these inside her "hands free" bra.  Reviewed pump and pump settings.  #27 flange size is appropriate at this time.    Mother has a Spectra DEBP for home use.  Family is hoping to be discharged tomorrow.  Father present and supportive.  RN updated.   Maternal Data Has patient been taught Hand Expression?: Yes Does the patient have breastfeeding experience prior to this delivery?: No  Feeding Mother's Current Feeding Choice: Breast Milk and Donor Milk  LATCH Score                    Lactation Tools Discussed/Used Flange Size: 27 Breast pump type: Double-Electric Breast Pump;Manual Pump Education: Setup, frequency, and cleaning;Milk Storage (Reviewed) Reason for Pumping: Nipple eversion; breast stimulation for supplementation for ETI Pumping frequency: Every three hours  Interventions    Discharge Pump: DEBP;Manual;Personal  Consult  Status Consult Status: Follow-up Date: 02/02/21 Follow-up type: In-patient    Joyce Massey 02/01/2021, 1:03 PM

## 2021-02-01 NOTE — Anesthesia Postprocedure Evaluation (Signed)
Anesthesia Post Note  Patient: Joyce Massey  Procedure(s) Performed: CESAREAN SECTION     Patient location during evaluation: PACU Anesthesia Type: General Level of consciousness: awake and alert Pain management: pain level controlled Vital Signs Assessment: post-procedure vital signs reviewed and stable Respiratory status: spontaneous breathing, nonlabored ventilation, respiratory function stable and patient connected to nasal cannula oxygen Cardiovascular status: blood pressure returned to baseline and stable Postop Assessment: no apparent nausea or vomiting Anesthetic complications: no   No notable events documented.  Last Vitals:  Vitals:   02/01/21 0406 02/01/21 0410  BP: 129/83 (!) 148/81  Pulse: 96 90  Resp:    Temp:    SpO2:  98%    Last Pain:  Vitals:   02/01/21 0520  TempSrc:   PainSc: 7    Pain Goal: Patients Stated Pain Goal: 5 (01/31/21 1851)                 [name]

## 2021-02-01 NOTE — Progress Notes (Signed)
Subjective: Postpartum Day 1: Cesarean Delivery Patient reports incisional pain, tolerating PO, and + flatus.   Feels like she is unable to clear her throat No SOB, no CP Objective: Vital signs in last 24 hours: Temp:  [97.9 F (36.6 C)-98.6 F (37 C)] 98.4 F (36.9 C) (09/14 0358) Pulse Rate:  [66-110] 90 (09/14 0410) Resp:  [11-19] 17 (09/14 0358) BP: (103-148)/(63-98) 148/81 (09/14 0410) SpO2:  [94 %-100 %] 98 % (09/14 0410) UO dilute Physical Exam:  General: alert, cooperative, and no distress Lochia: appropriate Uterine Fundus: firm Incision: healing well DVT Evaluation: No evidence of DVT seen on physical exam. DTR 2+ Lungs CTA Recent Labs    01/31/21 0814 02/01/21 0526  HGB 12.7 10.7*  HCT 36.5 29.9*   Results for orders placed or performed during the hospital encounter of 01/29/21 (from the past 24 hour(s))  Comprehensive metabolic panel     Status: Abnormal   Collection Time: 02/01/21  5:26 AM  Result Value Ref Range   Sodium 131 (L) 135 - 145 mmol/L   Potassium 4.2 3.5 - 5.1 mmol/L   Chloride 98 98 - 111 mmol/L   CO2 22 22 - 32 mmol/L   Glucose, Bld 133 (H) 70 - 99 mg/dL   BUN 7 6 - 20 mg/dL   Creatinine, Ser 0.68 0.44 - 1.00 mg/dL   Calcium 8.1 (L) 8.9 - 10.3 mg/dL   Total Protein 4.7 (L) 6.5 - 8.1 g/dL   Albumin 2.1 (L) 3.5 - 5.0 g/dL   AST 43 (H) 15 - 41 U/L   ALT 17 0 - 44 U/L   Alkaline Phosphatase 116 38 - 126 U/L   Total Bilirubin 0.4 0.3 - 1.2 mg/dL   GFR, Estimated >60 >60 mL/min   Anion gap 11 5 - 15  CBC     Status: Abnormal   Collection Time: 02/01/21  5:26 AM  Result Value Ref Range   WBC 11.7 (H) 4.0 - 10.5 K/uL   RBC 3.20 (L) 3.87 - 5.11 MIL/uL   Hemoglobin 10.7 (L) 12.0 - 15.0 g/dL   HCT 29.9 (L) 36.0 - 46.0 %   MCV 93.4 80.0 - 100.0 fL   MCH 33.4 26.0 - 34.0 pg   MCHC 35.8 30.0 - 36.0 g/dL   RDW 14.3 11.5 - 15.5 %   Platelets 160 150 - 400 K/uL   nRBC 0.0 0.0 - 0.2 %    Magnesium sulfate 1gm/hr  Assessment/Plan: Status  post Cesarean section. Postoperative course complicated by preeclampsia   Continue magnesium sulfate until 1600. Patient is concerned about pulmonary status because of "pre pulmonary edema" on admission CXR. Her oxygen saturation is 98% on RA and lungs are CTA. Will recheck oxygen saturation and repeat CXR. Also pain control has been difficult. Will stop oxycodone and start prn dilaudid po.  Shon Millet II 02/01/2021, 8:13 AM

## 2021-02-02 ENCOUNTER — Other Ambulatory Visit (HOSPITAL_COMMUNITY): Payer: Self-pay

## 2021-02-02 MED ORDER — OXYCODONE HCL 5 MG PO TABS
5.0000 mg | ORAL_TABLET | ORAL | 0 refills | Status: DC | PRN
  Filled 2021-02-02: qty 15, 3d supply, fill #0

## 2021-02-02 MED ORDER — HYDROMORPHONE HCL 4 MG PO TABS
2.0000 mg | ORAL_TABLET | ORAL | 0 refills | Status: AC | PRN
  Filled 2021-02-02: qty 15, 7d supply, fill #0

## 2021-02-02 MED ORDER — DOCUSATE SODIUM 100 MG PO CAPS
100.0000 mg | ORAL_CAPSULE | Freq: Two times a day (BID) | ORAL | 2 refills | Status: DC
  Filled 2021-02-02: qty 60, 30d supply, fill #0

## 2021-02-02 MED ORDER — IBUPROFEN 600 MG PO TABS
600.0000 mg | ORAL_TABLET | Freq: Four times a day (QID) | ORAL | 0 refills | Status: DC | PRN
  Filled 2021-02-02: qty 30, 8d supply, fill #0

## 2021-02-02 NOTE — Discharge Summary (Signed)
Postpartum Discharge Summary      Patient Name: Joyce Massey DOB: 1991/03/28 MRN: [id number]  Date of admission: 01/29/2021 Delivery date:01/31/2021  Delivering provider: Linda Hedges  Date of discharge: 02/02/2021  Admitting diagnosis: Pregnancy [Z34.90] Intrauterine pregnancy: [redacted]w[redacted]d    Secondary diagnosis:  Active Problems:   Pregnancy  Additional problems: pre-pulmonary edema    Discharge diagnosis: Preterm Pregnancy Delivered and Preeclampsia (severe)                                              Post partum procedures: Mag x 24 hours Augmentation: AROM, Pitocin, and Cytotec Complications: None  Hospital course: Induction of Labor With Cesarean Section   30y.o. yo G2P1011 at 326w1das admitted to the hospital 01/29/2021 for induction of labor. Patient had a labor course significant for severe PIH s/s possible pulmonary edema, intractable HA, and elevated BPs. The patient went for cesarean section due to Arrest of Dilation. Delivery details are as follows: Membrane Rupture Time/Date: 10:52 PM ,01/30/2021   Delivery Method:C-Section, Vacuum Assisted  Details of operation can be found in separate operative Note.  Patient had an uncomplicated postpartum course. She is ambulating, tolerating a regular diet, passing flatus, and urinating well.  Patient is discharged home in stable condition on 02/02/21.      Newborn Data: Birth date:01/31/2021  Birth time:3:47 PM  Gender:Female  Living status:Living  Apgars:6 ,8  Weight:3300 g                                Magnesium Sulfate received: Yes: Seizure prophylaxis BMZ received: Yes Rhophylac:N/A  Physical exam  Vitals:   02/02/21 0305 02/02/21 0750 02/02/21 0953 02/02/21 1142  BP: 129/78 136/74 132/88 139/82  Pulse: 89 84 89 94  Resp:  18  18  Temp: 98.2 F (36.8 C) 98.2 F (36.8 C)  98.4 F (36.9 C)  TempSrc: Oral Oral  Oral  SpO2: 98% 97%  97%  Weight:      Height:       General: alert Lochia: appropriate Uterine  Fundus: firm Incision: Healing well with no significant drainage DVT Evaluation: No evidence of DVT seen on physical exam. Labs: Lab Results  Component Value Date   WBC 11.7 (H) 02/01/2021   HGB 10.7 (L) 02/01/2021   HCT 29.9 (L) 02/01/2021   MCV 93.4 02/01/2021   PLT 160 02/01/2021   CMP Latest Ref Rng & Units 02/01/2021  Glucose 70 - 99 mg/dL 133(H)  BUN 6 - 20 mg/dL 7  Creatinine 0.44 - 1.00 mg/dL 0.68  Sodium 135 - 145 mmol/L 131(L)  Potassium 3.5 - 5.1 mmol/L 4.2  Chloride 98 - 111 mmol/L 98  CO2 22 - 32 mmol/L 22  Calcium 8.9 - 10.3 mg/dL 8.1(L)  Total Protein 6.5 - 8.1 g/dL 4.7(L)  Total Bilirubin 0.3 - 1.2 mg/dL 0.4  Alkaline Phos 38 - 126 U/L 116  AST 15 - 41 U/L 43(H)  ALT 0 - 44 U/L 17   Edinburgh Score: Edinburgh Postnatal Depression Scale Screening Tool 02/01/2021  I have been able to laugh and see the funny side of things. 0  I have looked forward with enjoyment to things. 0  I have blamed myself unnecessarily when things went wrong. 1  I have been anxious or worried  for no good reason. 1  I have felt scared or panicky for no good reason. 0  Things have been getting on top of me. 0  I have been so unhappy that I have had difficulty sleeping. 0  I have felt sad or miserable. 0  I have been so unhappy that I have been crying. 0  The thought of harming myself has occurred to me. 0  Edinburgh Postnatal Depression Scale Total 2      After visit meds:  Allergies as of 02/02/2021       Reactions   Wasp Venom Protein Swelling   Latex Rash   Tape Rash   Some tapes cause rashes (adhesives may be the reason)        Medication List     STOP taking these medications    Crisaborole 2 % Oint   Doxylamine-Pyridoxine 10-10 MG Tbec   ondansetron 8 MG tablet Commonly known as: ZOFRAN       TAKE these medications    docusate sodium 100 MG capsule Commonly known as: Colace Take 1 capsule (100 mg total) by mouth 2 (two) times daily.   famotidine 20 MG  tablet Commonly known as: PEPCID Take 20 mg by mouth 2 (two) times daily.   fluticasone 50 MCG/ACT nasal spray Commonly known as: FLONASE Place 1 spray into both nostrils daily.   HYDROmorphone 4 MG tablet Commonly known as: DILAUDID Take 0.5 tablets (2 mg total) by mouth every 4 (four) hours as needed for up to 7 days for severe pain.   ibuprofen 600 MG tablet Commonly known as: ADVIL Take 1 tablet (600 mg total) by mouth every 6 (six) hours as needed.   PRENATAL VITAMIN PO Take by mouth.   valACYclovir 500 MG tablet Commonly known as: VALTREX TAKE 1 TABLET BY MOUTH TWICE DAILY FOR 3 DAYS.         Discharge home in stable condition Infant Feeding: Bottle and Breast Infant Disposition:home with mother Discharge instruction: per After Visit Summary and Postpartum booklet. Activity: Advance as tolerated. Pelvic rest for 6 weeks.  Diet: routine diet Anticipated Birth Control: Unsure Postpartum Appointment:6 weeks Additional Postpartum F/U: Incision check 1 week Future Appointments:No future appointments. Follow up Visit:      02/02/2021 Tyson Dense, MD

## 2021-02-02 NOTE — Lactation Note (Addendum)
This note was copied from a baby's chart. Lactation Consultation Note  Patient Name: Joyce Massey Today's Date: 02/02/2021 Reason for consult: Follow-up assessment;Mother's request;Difficult latch;Early term 37-38.6wks;Infant weight loss;Hyperbilirubinemia;Other (Comment) (PIH) Age:31 hours  Mom requested a NS to assist with latching. Currently, Mom declined [facility] services stating resting at this time.   Mom to transition to formula today prior to discharge. BF supplementation guide provided based on hrs of age since delivery Mom to increase volume if infant not latching at the breast. (Circled on handout for mother to review)  RN, Avelina Laine will help Mom with applying 24 NS or Mom will call for [facility] assistance.  Plan 1. To feed based on cues 8-12x 24 hr period.  2. Mom to try latching at breast and if needed apply 24 NS.  3. Mom to supplement with EBM first followed by formula based on BF supplementation guide.  4. Mom need to pump with DEBP q 3 hrs for 15 min.   Feeding plan reviewed with RN, Avelina Laine [facility] talked with Mom via Vocera with help of RN Avelina Laine. Mother stated use of 24 NS going well. Mom understands use of NS barrier to letdown need to pump with DEBP q 3 hrs for 76mn.   Maternal Data    Feeding Mother's Current Feeding Choice: Breast Milk and Donor Milk Nipple Type: Extra Slow Flow  LATCH Score                    Lactation Tools Discussed/Used Tools: Pump;Flanges;Shells;Nipple Shields Nipple shield size: 24 Flange Size: 27 Breast pump type: Double-Electric Breast Pump Pump Education: Setup, frequency, and cleaning;Milk Storage Reason for Pumping: increase stimulation Pumping frequency: every 3 hrs for 15 min  Interventions Interventions: Breast feeding basics reviewed;DEBP;Education;Pace feeding;Shells  Discharge Pump: Personal  Consult Status Consult Status: Follow-up Date: 02/03/21 Follow-up type: In-patient    Joyce Massey   Joyce Massey 02/02/2021, 11:30 AM

## 2021-02-02 NOTE — Progress Notes (Signed)
Subjective: Postpartum Day 2: Cesarean Delivery Patient reports incisional pain, tolerating PO, + flatus, and no problems voiding.  Still with pain but much better controlled. Feels exhausted bc has only slept in 30 min increments since admission. Would really like to go home to help with sleep but baby may not be ready for dc today.   Objective: Vital signs in last 24 hours: Temp:  [97.8 F (36.6 C)-98.5 F (36.9 C)] 98.2 F (36.8 C) (09/15 0750) Pulse Rate:  [77-89] 84 (09/15 0750) Resp:  [16-18] 18 (09/15 0750) BP: (126-141)/(74-85) 136/74 (09/15 0750) SpO2:  [97 %-98 %] 97 % (09/15 0750)  Physical Exam:  General: alert and cooperative Lochia: appropriate Uterine Fundus: firm Incision: healing well DVT Evaluation: No evidence of DVT seen on physical exam.  Recent Labs    01/31/21 0814 02/01/21 0526  HGB 12.7 10.7*  HCT 36.5 29.9*    Assessment/Plan: Status post Cesarean section. Doing well postoperatively.  Continue current care - pain much better controlled on schedule ibuprofen and tylenol with prn dilaudid '4mg'$ . Exam remains benign. Vs reassuring. BP good off Mag. Anticipate dc later today if baby able to go home. Otherwise dc tomorrow. Precautions given.    Tyson Dense 02/02/2021, 9:14 AM

## 2021-02-14 ENCOUNTER — Telehealth (HOSPITAL_COMMUNITY): Payer: Self-pay | Admitting: *Deleted

## 2021-02-14 NOTE — Telephone Encounter (Signed)
Patient voiced no questions or concerns regarding her own health. EPDS = 1. Patient voiced no questions or concerns regarding baby at this time. Patient reported infant sleeps in a bassinet on her back. RN reviewed ABCs of safe sleep - patient verbalized understanding. Patient requested RN email information on hospital's virtual postpartum classes and support groups. Email sent. Erline Levine, RN, 02/14/21, [phone].

## 2021-02-16 ENCOUNTER — Other Ambulatory Visit (HOSPITAL_COMMUNITY): Payer: Self-pay

## 2021-02-16 MED FILL — Valacyclovir HCl Tab 500 MG: ORAL | 15 days supply | Qty: 30 | Fill #1 | Status: AC

## 2021-03-13 ENCOUNTER — Other Ambulatory Visit (HOSPITAL_COMMUNITY): Payer: Self-pay

## 2021-03-13 MED ORDER — CLOTRIMAZOLE 1 % EX CREA: TOPICAL_CREAM | CUTANEOUS | 0 refills | Status: DC

## 2021-03-14 ENCOUNTER — Other Ambulatory Visit (HOSPITAL_COMMUNITY): Payer: Self-pay

## 2021-03-16 DIAGNOSIS — Z1389 Encounter for screening for other disorder: Secondary | ICD-10-CM | POA: Diagnosis not present

## 2021-03-16 DIAGNOSIS — Z124 Encounter for screening for malignant neoplasm of cervix: Secondary | ICD-10-CM | POA: Diagnosis not present

## 2021-03-17 ENCOUNTER — Other Ambulatory Visit (HOSPITAL_COMMUNITY): Payer: Self-pay

## 2021-03-17 MED ORDER — NORETHINDRONE 0.35 MG PO TABS
1.0000 | ORAL_TABLET | Freq: Every day | ORAL | 3 refills | Status: DC
  Filled 2021-03-17: qty 84, 84d supply, fill #0

## 2021-03-17 MED ORDER — FLUCONAZOLE 150 MG PO TABS
150.0000 mg | ORAL_TABLET | Freq: Every day | ORAL | 0 refills | Status: DC
  Filled 2021-03-17: qty 1, 1d supply, fill #0

## 2021-04-11 ENCOUNTER — Other Ambulatory Visit (HOSPITAL_COMMUNITY): Payer: Self-pay

## 2021-04-11 DIAGNOSIS — J029 Acute pharyngitis, unspecified: Secondary | ICD-10-CM | POA: Diagnosis not present

## 2021-04-11 DIAGNOSIS — Z03818 Encounter for observation for suspected exposure to other biological agents ruled out: Secondary | ICD-10-CM | POA: Diagnosis not present

## 2021-04-11 DIAGNOSIS — B349 Viral infection, unspecified: Secondary | ICD-10-CM | POA: Diagnosis not present

## 2021-04-11 MED ORDER — OSELTAMIVIR PHOSPHATE 75 MG PO CAPS
75.0000 mg | ORAL_CAPSULE | Freq: Two times a day (BID) | ORAL | 0 refills | Status: DC
  Filled 2021-04-11: qty 10, 5d supply, fill #0

## 2022-01-25 ENCOUNTER — Encounter: Payer: Self-pay | Admitting: Physician Assistant

## 2022-01-25 ENCOUNTER — Ambulatory Visit
Admission: EM | Admit: 2022-01-25 | Discharge: 2022-01-25 | Disposition: A | Discharge status: Home / Self Care | Payer: 59 | Attending: Physician Assistant | Admitting: Physician Assistant

## 2022-01-25 DIAGNOSIS — J069 Acute upper respiratory infection, unspecified: Secondary | ICD-10-CM | POA: Insufficient documentation

## 2022-01-25 DIAGNOSIS — Z1152 Encounter for screening for COVID-19: Secondary | ICD-10-CM | POA: Insufficient documentation

## 2022-01-25 DIAGNOSIS — J029 Acute pharyngitis, unspecified: Secondary | ICD-10-CM | POA: Diagnosis not present

## 2022-01-25 LAB — RESP PANEL BY RT-PCR (FLU A&B, COVID) ARPGX2
Influenza A by PCR: NEGATIVE
Influenza B by PCR: NEGATIVE
SARS Coronavirus 2 by RT PCR: NEGATIVE

## 2022-01-25 LAB — POCT RAPID STREP A (OFFICE): Rapid Strep A Screen: NEGATIVE

## 2022-01-25 NOTE — ED Triage Notes (Signed)
Pt is present today with c/o HA and sore throat. Pt sx started this morning

## 2022-01-25 NOTE — ED Provider Notes (Signed)
[facility]    CSN: [id number] Arrival date & time: 01/25/22  4098      History   Chief Complaint Chief Complaint  Patient presents with   Sore Throat   Headache    HPI Joyce Massey is a 31 y.o. female.   Patient here today for evaluation of headache and sore throat.  She reports that symptoms woke her up around 4 AM this morning.  She denies any cough.  She has had some congestion.  She has taken over-the-counter medication with mild relief.  She denies any nausea, vomiting or diarrhea.  The history is provided by the patient.  Sore Throat Associated symptoms include headaches. Pertinent negatives include no abdominal pain and no shortness of breath.  Headache Associated symptoms: congestion and sore throat   Associated symptoms: no abdominal pain, no cough, no diarrhea, no ear pain, no fever, no nausea and no vomiting     Past Medical History:  Diagnosis Date   ADHD (attention deficit hyperactivity disorder)    Anxiety    Eczema    Endometriosis, mild    GERD (gastroesophageal reflux disease)    History of gastric ulcer    Ovarian cyst    Pelvic pain     Patient Active Problem List   Diagnosis Date Noted   Pregnancy 01/29/2021   Abnormal uterine bleeding 01/26/2021   Endometriosis determined by laparoscopy 07/12/2016   Acute pain in female pelvis 07/11/2016   Left ovarian cyst 07/11/2016   Eczema 06/21/2016   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   LAPAROSCOPIC OVARIAN CYSTECTOMY Left 07/12/2016   Procedure: OPERATIVE LAPAROSCOPIC OVARIAN CYSTECTOMY AND PERITONEAL BIOPSY;  Surgeon: Eldred Manges, MD;  Location: [place];  Service: Gynecology;  Laterality: Left;    WISDOM TOOTH EXTRACTION      OB History     Gravida  2   Para  1   Term  1   Preterm  0   AB  1   Living  1      SAB  0   IAB  0   Ectopic  1   Multiple  0   Live Births  1        Obstetric Comments  Ectopic treated with Methotrexate          Home Medications    Prior to Admission medications   Medication Sig Start Date End Date Taking? Authorizing Provider  clotrimazole (CLOTRIMAZOLE ANTI-FUNGAL) 1 % cream APPLY TO THE AFFECTED AND SURROUNDING AREAS OF SKIN BY TOPICAL ROUTE 2 TIMES PER DAY IN THE MORNING AND EVENING 03/13/21     docusate sodium (COLACE) 100 MG capsule Take 1 capsule (100 mg total) by mouth 2 (two) times daily. 02/02/21   Tyson Dense, MD  famotidine (PEPCID) 20 MG tablet Take 20 mg by mouth 2 (two) times daily.    [provider]  fluconazole (DIFLUCAN) 150 MG tablet Take 1 tablet (150 mg total) by mouth 03/16/21     fluticasone (FLONASE) 50 MCG/ACT nasal spray Place 1 spray into both nostrils daily.    [provider]  ibuprofen (ADVIL) 600 MG tablet Take 1 tablet (600 mg total) by mouth every 6 (  six) hours as needed. 02/02/21   Tyson Dense, MD  norethindrone (CAMILA) 0.35 MG tablet Take 1 tablet (0.35 mg total) by mouth daily. 03/16/21     oseltamivir (TAMIFLU) 75 MG capsule Take 1 capsule (75 mg total) by mouth 2 (two) times daily. 04/11/21     Prenatal Vit-Fe Fumarate-FA (PRENATAL VITAMIN PO) Take by mouth.    [provider]    Family History Family History  Problem Relation Age of Onset   Healthy Mother    Hypertension Father    Heart disease Maternal Grandmother     Social History Social History   Tobacco Use   Smoking status: Former    Packs/day: 0.50    Years: 5.00    Total pack years: 2.50    Types: Cigarettes    Quit date: 07/09/2014    Years since quitting: 7.5   Smokeless tobacco: Never  Vaping Use   Vaping Use: Never used  Substance Use Topics   Alcohol use: Not  Currently    Alcohol/week: 2.0 standard drinks of alcohol    Types: 2 Standard drinks or equivalent per week    Comment: occasional   Drug use: No     Allergies   Wasp venom protein, Latex, and Tape   Review of Systems Review of Systems  Constitutional:  Negative for chills and fever.  HENT:  Positive for congestion and sore throat. Negative for ear pain.   Eyes:  Negative for discharge and redness.  Respiratory:  Negative for cough, shortness of breath and wheezing.   Gastrointestinal:  Negative for abdominal pain, diarrhea, nausea and vomiting.  Neurological:  Positive for headaches.     Physical Exam Triage Vital Signs ED Triage Vitals [01/25/22 0930]  Enc Vitals Group     BP 132/87     Pulse Rate 71     Resp 18     Temp 98 F (36.7 C)     Temp src      SpO2 98 %     Weight      Height      Head Circumference      Peak Flow      Pain Score 5     Pain Loc      Pain Edu?      Excl. in [place]?    No data found.  Updated Vital Signs BP 132/87   Pulse 71   Temp 98 F (36.7 C)   Resp 18   SpO2 98%   Breastfeeding Yes   Physical Exam Vitals and nursing note reviewed.  Constitutional:      General: She is not in acute distress.    Appearance: Normal appearance. She is not ill-appearing.  HENT:     Head: Normocephalic and atraumatic.     Nose: Congestion present.     Mouth/Throat:     Mouth: Mucous membranes are moist.     Pharynx: Posterior oropharyngeal erythema present. No oropharyngeal exudate.  Eyes:     Conjunctiva/sclera: Conjunctivae normal.  Cardiovascular:     Rate and Rhythm: Normal rate and regular rhythm.     Heart sounds: Normal heart sounds. No murmur heard. Pulmonary:     Effort: Pulmonary effort is normal. No respiratory distress.     Breath sounds: Normal breath sounds. No wheezing, rhonchi or rales.  Skin:    General: Skin is warm and dry.  Neurological:     Mental Status: She is alert.  Psychiatric:  Mood and Affect: Mood  normal.        Thought Content: Thought content normal.      UC Treatments / Results  Labs (all labs ordered are listed, but only abnormal results are displayed) Labs Reviewed  CULTURE, GROUP A STREP ([place])  RESP PANEL BY RT-PCR (FLU A&B, COVID) [id number]  POCT RAPID STREP A (OFFICE)    EKG   Radiology No results found.  Procedures Procedures (including critical care time)  Medications Ordered in UC Medications - No data to display  Initial Impression / Assessment and Plan / UC Course  I have reviewed the triage vital signs and the nursing notes.  Pertinent labs & imaging results that were available during my care of the patient were reviewed by me and considered in my medical decision making (see chart for details).    Rapid strep negative in office.  Will order throat culture as well as COVID and flu screening.  Will await results further recommendation.  Encouraged symptomatic treatment and increase fluids in the meantime.  Recommend follow-up with any further concerns.  Final Clinical Impressions(s) / UC Diagnoses   Final diagnoses:  Acute upper respiratory infection  Encounter for screening for COVID-19  Acute pharyngitis, unspecified etiology   Discharge Instructions   None    ED Prescriptions   None    PDMP not reviewed this encounter.   Francene Finders, PA-C 01/25/22 1052

## 2022-01-27 LAB — CULTURE, GROUP A STREP (THRC)

## 2022-02-16 ENCOUNTER — Ambulatory Visit
Admission: EM | Admit: 2022-02-16 | Discharge: 2022-02-16 | Disposition: A | Discharge status: Home / Self Care | Payer: 59

## 2022-02-16 ENCOUNTER — Other Ambulatory Visit (HOSPITAL_COMMUNITY): Payer: Self-pay

## 2022-08-28 LAB — OB RESULTS CONSOLE HEPATITIS B SURFACE ANTIGEN: Hepatitis B Surface Ag: NEGATIVE

## 2022-08-28 LAB — OB RESULTS CONSOLE HIV ANTIBODY (ROUTINE TESTING): HIV: NONREACTIVE

## 2022-08-28 LAB — OB RESULTS CONSOLE ANTIBODY SCREEN: Antibody Screen: NEGATIVE

## 2022-08-28 LAB — OB RESULTS CONSOLE RUBELLA ANTIBODY, IGM: Rubella: IMMUNE

## 2022-08-28 LAB — HEPATITIS C ANTIBODY: HCV Ab: NEGATIVE

## 2022-09-12 LAB — OB RESULTS CONSOLE GC/CHLAMYDIA
Chlamydia: NEGATIVE
Neisseria Gonorrhea: NEGATIVE

## 2023-01-07 LAB — OB RESULTS CONSOLE RPR: RPR: NONREACTIVE

## 2023-01-25 ENCOUNTER — Inpatient Hospital Stay (HOSPITAL_COMMUNITY)
Admission: AD | Admit: 2023-01-25 | Discharge: 2023-01-25 | Disposition: A | Discharge status: Home / Self Care | Payer: 59 | Attending: Family Medicine | Admitting: Family Medicine

## 2023-01-25 ENCOUNTER — Other Ambulatory Visit: Payer: Self-pay

## 2023-01-25 ENCOUNTER — Encounter (HOSPITAL_COMMUNITY): Payer: Self-pay

## 2023-01-25 DIAGNOSIS — O4700 False labor before 37 completed weeks of gestation, unspecified trimester: Secondary | ICD-10-CM | POA: Diagnosis not present

## 2023-01-25 DIAGNOSIS — B3731 Acute candidiasis of vulva and vagina: Secondary | ICD-10-CM

## 2023-01-25 DIAGNOSIS — Z3A30 30 weeks gestation of pregnancy: Secondary | ICD-10-CM | POA: Insufficient documentation

## 2023-01-25 DIAGNOSIS — O4703 False labor before 37 completed weeks of gestation, third trimester: Secondary | ICD-10-CM | POA: Insufficient documentation

## 2023-01-25 DIAGNOSIS — O368130 Decreased fetal movements, third trimester, not applicable or unspecified: Secondary | ICD-10-CM

## 2023-01-25 LAB — WET PREP, GENITAL
Clue Cells Wet Prep HPF POC: NONE SEEN
Sperm: NONE SEEN
Trich, Wet Prep: NONE SEEN
WBC, Wet Prep HPF POC: <10 (ref <10)
Yeast Wet Prep HPF POC: NONE SEEN

## 2023-01-25 LAB — URINALYSIS, ROUTINE W REFLEX MICROSCOPIC
Bilirubin Urine: NEGATIVE
Glucose, UA: NEGATIVE mg/dL
Hgb urine dipstick: NEGATIVE
Ketones, ur: NEGATIVE mg/dL
Leukocytes,Ua: NEGATIVE
Nitrite: NEGATIVE
Protein, ur: NEGATIVE mg/dL
Specific Gravity, Urine: 1.012 (ref 1.005–1.030)
pH: 6 (ref 5.0–8.0)

## 2023-01-25 MED ORDER — FLUCONAZOLE 150 MG PO TABS
150.0000 mg | ORAL_TABLET | Freq: Once | ORAL | Status: AC
  Administered 2023-01-25: 150 mg via ORAL
  Filled 2023-01-25: qty 1

## 2023-01-25 MED ORDER — LACTATED RINGERS IV BOLUS: 1000.0000 mL | Freq: Once | INTRAVENOUS | Status: DC

## 2023-01-25 NOTE — MAU Note (Signed)
Joyce Massey is a 32 y.o. at [redacted]w[redacted]d here in MAU reporting: ctxs every 10 minutes since 1500. States they are every 10 mins but have not resolved with resting and hydration. Denies any VB. Reports extra "wetness" over the last 2 days. Pt states she pees on herself a lot but is unsure if it is pee or not. Reports slower/less movement from baby today. Denies any vaginal itching, odor, or pain. Denies any abnormal vaginal discharge.  Pt support person came back with patient unable to ask security questions at this time.    Onset of complaint: 1500 Pain score: 4 Vitals:   01/25/23 1744  BP: 124/72  Pulse: 86  Resp: 18  Temp: 98.3 F (36.8 C)     FHT:130 Lab orders placed from triage:

## 2023-01-25 NOTE — MAU Provider Note (Signed)
History     [id number]  Arrival date and time: 01/25/23 1709    Chief Complaint  Patient presents with   Decreased Fetal Movement   Contractions     HPI Joyce Massey is a 32 y.o. at [redacted]w[redacted]d with PMHx notable for one prior cesarean, anxiety, who presents for multiple issues.   Patient reports feeling contractions about every 10-15 minutes Has been thinking she's dehydrated so drinking lots of water  Also feeling a burning sensation in her vagina that comes and goes No vaginal discharge or odor No burning or pain with urination Has noticed increased moisture in her perineum but no large gush of fluid and does not think her water broke Has had some areas concerning for candida in her inguinal folds and has been using lotrimin with good effect  Also feeling decreased fetal movement prior to arrival This has since resolved since coming to MAU  No vaginal bleeding     OB History     Gravida  3   Para  1   Term  1   Preterm  0   AB  1   Living  1      SAB  0   IAB  0   Ectopic  1   Multiple  0   Live Births  1        Obstetric Comments  Ectopic treated with Methotrexate         Past Medical History:  Diagnosis Date   ADHD (attention deficit hyperactivity disorder)    Anxiety    Eczema    Endometriosis, mild    GERD (gastroesophageal reflux disease)    History of gastric ulcer    Ovarian cyst    Pelvic pain     Past Surgical History:  Procedure Laterality Date   CESAREAN SECTION N/A 01/31/2021   Procedure: CESAREAN SECTION;  Surgeon: Mitchel Honour, DO;  Location: [facility];  Service: Obstetrics;  Laterality: N/A;   LAPAROSCOPIC CHOLECYSTECTOMY  2012  approx.   LAPAROSCOPIC OVARIAN CYSTECTOMY Left 07/12/2016   Procedure: OPERATIVE LAPAROSCOPIC OVARIAN CYSTECTOMY AND PERITONEAL BIOPSY;  Surgeon: Hal Morales, MD;  Location: [facility];  Service: Gynecology;  Laterality: Left;   WISDOM TOOTH EXTRACTION      Family  History  Problem Relation Age of Onset   Healthy Mother    Hypertension Father    Heart disease Maternal Grandmother     Social History   Socioeconomic History   Marital status: Married    Spouse name: Not on file   Number of children: Not on file   Years of education: Not on file   Highest education level: Not on file  Occupational History   Not on file  Tobacco Use   Smoking status: Former    Current packs/day: 0.00    Average packs/day: 0.5 packs/day for 5.0 years (2.5 ttl pk-yrs)    Types: Cigarettes    Start date: 07/09/2009    Quit date: 07/09/2014    Years since quitting: 8.5   Smokeless tobacco: Never  Vaping Use   Vaping status: Never Used  Substance and Sexual Activity   Alcohol use: Not Currently    Alcohol/week: 2.0 standard drinks of alcohol    Types: 2 Standard drinks or equivalent per week    Comment: occasional   Drug use: No   Sexual activity: Yes    Partners: Male    Birth control/protection: None  Other Topics Concern   Not on  file  Social History Narrative   Not on file   Social Determinants of Health   Financial Resource Strain: Not on file  Food Insecurity: Not on file  Transportation Needs: Not on file  Physical Activity: Not on file  Stress: Not on file  Social Connections: Unknown (07/04/2022)   Received from [facility]   Social Network    Social Network: Not on file  Intimate Partner Violence: Unknown (07/04/2022)   Received from [facility]   HITS    Physically Hurt: Not on file    Insult or Talk Down To: Not on file    Threaten Physical Harm: Not on file    Scream or Curse: Not on file    Allergies  Allergen Reactions   Wasp Venom Protein Swelling   Latex Rash   Tape Rash    Some tapes cause rashes (adhesives may be the reason)    No current facility-administered medications on file prior to encounter.   Current Outpatient Medications on File Prior to Encounter  Medication Sig Dispense Refill   aspirin EC 81 MG  tablet Take 81 mg by mouth daily. Swallow whole.     famotidine (PEPCID) 20 MG tablet Take 20 mg by mouth 2 (two) times daily.     fluticasone (FLONASE) 50 MCG/ACT nasal spray Place 1 spray into both nostrils daily.     Prenatal Vit-Fe Fumarate-FA (PRENATAL VITAMIN PO) Take by mouth.     sertraline (ZOLOFT) 50 MG tablet Take 50 mg by mouth daily.     clotrimazole (CLOTRIMAZOLE ANTI-FUNGAL) 1 % cream APPLY TO THE AFFECTED AND SURROUNDING AREAS OF SKIN BY TOPICAL ROUTE 2 TIMES PER DAY IN THE MORNING AND EVENING 15 g 0   docusate sodium (COLACE) 100 MG capsule Take 1 capsule (100 mg total) by mouth 2 (two) times daily. 60 capsule 2   fluconazole (DIFLUCAN) 150 MG tablet Take 1 tablet (150 mg total) by mouth 1 tablet 0   ibuprofen (ADVIL) 600 MG tablet Take 1 tablet (600 mg total) by mouth every 6 (six) hours as needed. 30 tablet 0   norethindrone (CAMILA) 0.35 MG tablet Take 1 tablet (0.35 mg total) by mouth daily. 84 tablet 3   oseltamivir (TAMIFLU) 75 MG capsule Take 1 capsule (75 mg total) by mouth 2 (two) times daily. 10 capsule 0     ROS Pertinent positives and negative per HPI, all others reviewed and negative  Physical Exam   BP 115/73   Pulse 85   Temp 98.3 F (36.8 C) (Oral)   Resp 18   Ht 5\' 7"  (1.702 m)   Wt 100.2 kg   SpO2 98%   BMI 34.61 kg/m   Patient Vitals for the past 24 hrs:  BP Temp Temp src Pulse Resp SpO2 Height Weight  01/25/23 1751 115/73 -- -- 85 -- 98 % -- --  01/25/23 1744 124/72 98.3 F (36.8 C) Oral 86 18 -- -- --  01/25/23 1738 -- -- -- -- -- -- 5\' 7"  (1.702 m) 100.2 kg    Physical Exam Vitals reviewed.  Constitutional:      General: She is not in acute distress.    Appearance: She is well-developed. She is not diaphoretic.  Eyes:     General: No scleral icterus. Pulmonary:     Effort: Pulmonary effort is normal. No respiratory distress.  Abdominal:     General: There is no distension.     Palpations: Abdomen is soft.     Tenderness:  There  is no abdominal tenderness. There is no guarding or rebound.  Genitourinary:    Comments: Mild erythema in the labial folds, unremarkable introitus SVE with cervix closed/long Skin:    General: Skin is warm and dry.  Neurological:     Mental Status: She is alert.     Coordination: Coordination normal.      Cervical Exam    Bedside Ultrasound Not performed.  My interpretation: n/a  FHT Baseline: 140 bpm Variability: Good {> 6 bpm) Accelerations: Reactive Decelerations: Absent Uterine activity: None Cat: I  Labs Results for orders placed or performed during the hospital encounter of 01/25/23 (from the past 24 hour(s))  Urinalysis, Routine w reflex microscopic -Urine, Clean Catch     Status: None   Collection Time: 01/25/23  5:53 PM  Result Value Ref Range   Color, Urine YELLOW YELLOW   APPearance CLEAR CLEAR   Specific Gravity, Urine 1.012 1.005 - 1.030   pH 6.0 5.0 - 8.0   Glucose, UA NEGATIVE NEGATIVE mg/dL   Hgb urine dipstick NEGATIVE NEGATIVE   Bilirubin Urine NEGATIVE NEGATIVE   Ketones, ur NEGATIVE NEGATIVE mg/dL   Protein, ur NEGATIVE NEGATIVE mg/dL   Nitrite NEGATIVE NEGATIVE   Leukocytes,Ua NEGATIVE NEGATIVE  Wet prep, genital     Status: None   Collection Time: 01/25/23  6:42 PM   Specimen: Vaginal  Result Value Ref Range   Yeast Wet Prep HPF POC NONE SEEN NONE SEEN   Trich, Wet Prep NONE SEEN NONE SEEN   Clue Cells Wet Prep HPF POC NONE SEEN NONE SEEN   WBC, Wet Prep HPF POC <10 <10   Sperm NONE SEEN     Imaging No results found.  MAU Course  Procedures Lab Orders         Wet prep, genital         Urinalysis, Routine w reflex microscopic -Urine, Clean Catch    Meds ordered this encounter  Medications   lactated ringers bolus 1,000 mL   fluconazole (DIFLUCAN) tablet 150 mg   Imaging Orders  No imaging studies ordered today    MDM Moderate (Level 3-4)  Assessment and Plan  # Preterm contractions #[redacted] weeks gestation of  pregnancy Resolved while in MAU. Cervix closed on exam. Provided reassurance.  #Vaginal burning Swab shows no infection. Given diflucan empirically based on exam.  #Decreased fetal movement Resolved while in MAU. NST reactive.   #FWB FHT Cat I NST: Reactive   Dispo: discharged to home in stable condition    Venora Maples, MD/MPH 01/25/23 8:01 PM  Allergies as of 01/25/2023       Reactions   Wasp Venom Protein Swelling   Latex Rash   Tape Rash   Some tapes cause rashes (adhesives may be the reason)        Medication List     STOP taking these medications    clotrimazole 1 % cream Commonly known as: Clotrimazole Anti-Fungal   docusate sodium 100 MG capsule Commonly known as: Colace   fluconazole 150 MG tablet Commonly known as: Diflucan   ibuprofen 600 MG tablet Commonly known as: ADVIL   norethindrone 0.35 MG tablet Commonly known as: Camila   oseltamivir 75 MG capsule Commonly known as: Tamiflu       TAKE these medications    aspirin EC 81 MG tablet Take 81 mg by mouth daily. Swallow whole.   famotidine 20 MG tablet Commonly known as: PEPCID Take 20 mg by mouth 2 (  two) times daily.   fluticasone 50 MCG/ACT nasal spray Commonly known as: FLONASE Place 1 spray into both nostrils daily.   PRENATAL VITAMIN PO Take by mouth.   sertraline 50 MG tablet Commonly known as: ZOLOFT Take 50 mg by mouth daily.

## 2023-01-28 LAB — GC/CHLAMYDIA PROBE AMP (~~LOC~~) NOT AT ARMC
Chlamydia: NEGATIVE
Comment: NEGATIVE
Comment: NORMAL
Neisseria Gonorrhea: NEGATIVE

## 2023-03-05 ENCOUNTER — Inpatient Hospital Stay (HOSPITAL_COMMUNITY)
Admission: AD | Admit: 2023-03-05 | Discharge: 2023-03-05 | Disposition: A | Discharge status: Home / Self Care | Payer: 59 | Attending: Obstetrics & Gynecology | Admitting: Obstetrics & Gynecology

## 2023-03-05 ENCOUNTER — Encounter (HOSPITAL_COMMUNITY): Payer: Self-pay | Admitting: Obstetrics and Gynecology

## 2023-03-05 ENCOUNTER — Inpatient Hospital Stay (HOSPITAL_COMMUNITY): Payer: 59

## 2023-03-05 DIAGNOSIS — R0602 Shortness of breath: Secondary | ICD-10-CM | POA: Diagnosis present

## 2023-03-05 DIAGNOSIS — R519 Headache, unspecified: Secondary | ICD-10-CM | POA: Insufficient documentation

## 2023-03-05 DIAGNOSIS — Z3A35 35 weeks gestation of pregnancy: Secondary | ICD-10-CM | POA: Insufficient documentation

## 2023-03-05 DIAGNOSIS — O26893 Other specified pregnancy related conditions, third trimester: Secondary | ICD-10-CM | POA: Insufficient documentation

## 2023-03-05 DIAGNOSIS — R030 Elevated blood-pressure reading, without diagnosis of hypertension: Secondary | ICD-10-CM | POA: Diagnosis not present

## 2023-03-05 DIAGNOSIS — I10 Essential (primary) hypertension: Secondary | ICD-10-CM | POA: Diagnosis present

## 2023-03-05 DIAGNOSIS — O163 Unspecified maternal hypertension, third trimester: Secondary | ICD-10-CM

## 2023-03-05 LAB — COMPREHENSIVE METABOLIC PANEL
ALT: 12 U/L (ref 0–44)
AST: 18 U/L (ref 15–41)
Albumin: 3 g/dL — ABNORMAL LOW (ref 3.5–5.0)
Alkaline Phosphatase: 152 U/L — ABNORMAL HIGH (ref 38–126)
Anion gap: 12 (ref 5–15)
BUN: <5 mg/dL — ABNORMAL LOW (ref 6–20)
CO2: 17 mmol/L — ABNORMAL LOW (ref 22–32)
Calcium: 9.8 mg/dL (ref 8.9–10.3)
Chloride: 107 mmol/L (ref 98–111)
Creatinine, Ser: 0.61 mg/dL (ref 0.44–1.00)
GFR, Estimated: >60 mL/min (ref >60)
Glucose, Bld: 101 mg/dL — ABNORMAL HIGH (ref 70–99)
Potassium: 4 mmol/L (ref 3.5–5.1)
Sodium: 136 mmol/L (ref 135–145)
Total Bilirubin: 0.5 mg/dL (ref 0.3–1.2)
Total Protein: 6.3 g/dL — ABNORMAL LOW (ref 6.5–8.1)

## 2023-03-05 LAB — CBC
HCT: 37.7 % (ref 36.0–46.0)
Hemoglobin: 13.6 g/dL (ref 12.0–15.0)
MCH: 32.9 pg (ref 26.0–34.0)
MCHC: 36.1 g/dL — ABNORMAL HIGH (ref 30.0–36.0)
MCV: 91.3 fL (ref 80.0–100.0)
Platelets: 222 10*3/uL (ref 150–400)
RBC: 4.13 MIL/uL (ref 3.87–5.11)
RDW: 14.1 % (ref 11.5–15.5)
WBC: 9.7 10*3/uL (ref 4.0–10.5)
nRBC: 0 % (ref 0.0–0.2)

## 2023-03-05 LAB — PROTEIN / CREATININE RATIO, URINE
Creatinine, Urine: 26 mg/dL
Total Protein, Urine: <6 mg/dL

## 2023-03-05 MED ORDER — DEXAMETHASONE SODIUM PHOSPHATE 10 MG/ML IJ SOLN
10.0000 mg | INTRAMUSCULAR | Status: AC
  Administered 2023-03-05: 10 mg via INTRAVENOUS
  Filled 2023-03-05: qty 1

## 2023-03-05 MED ORDER — DIPHENHYDRAMINE HCL 50 MG/ML IJ SOLN
25.0000 mg | INTRAMUSCULAR | Status: AC
  Administered 2023-03-05: 25 mg via INTRAVENOUS
  Filled 2023-03-05: qty 1

## 2023-03-05 MED ORDER — PROCHLORPERAZINE EDISYLATE 10 MG/2ML IJ SOLN
10.0000 mg | Freq: Once | INTRAMUSCULAR | Status: AC
  Administered 2023-03-05: 10 mg via INTRAVENOUS
  Filled 2023-03-05: qty 2

## 2023-03-05 MED ORDER — LACTATED RINGERS IV BOLUS
1000.0000 mL | Freq: Once | INTRAVENOUS | Status: AC
  Administered 2023-03-05: 1000 mL via INTRAVENOUS

## 2023-03-05 NOTE — Discharge Instructions (Signed)
Purchase this over-the-counter headache reliever from [employer] and take it as directed the next time you have a headache. Call your doctor's office, if it does not relieve your headache along with elevated blood pressures.

## 2023-03-05 NOTE — MAU Note (Addendum)
.  Joyce Massey is a 32 y.o. at [redacted]w[redacted]d here in MAU reporting: Sent over from the office for severe range pressures, HA, SOB, and +2 pitting edema. Reports she has had a HA since yesterday and spots in her field of vision. Reports a sense of doom like she felt in her last pregnancy when she was diagnosed with pre-e. Denies VB or LOF. +FM. Very anxious.  -Reports history of pre-e. -Has not taken anything for her HA today. -Hx C/S due to failure to progress. Planning RCS if she is dx with pre-e.  Onset of complaint: Yesterday Pain score:  5/10 HA - anterior  FHT: unable to assess d/t maternal clothing Lab orders placed from triage: placed by CNM

## 2023-03-05 NOTE — MAU Provider Note (Signed)
History     CSN: [id number]  Arrival date and time: 03/05/23 1045   Event Date/Time   First Provider Initiated Contact with Patient 03/05/23 1133      Chief Complaint  Patient presents with   Hypertension   Headache   Shortness of Breath   HPI Joyce Massey is a 32 y.o. year old G49P1011 female at [redacted]w[redacted]d weeks gestation who was sent to MAU from her OB office for severe range BPs, H/A since yesterday (03/04/2023), SOB for a "few days," blurry vision" for 1-2 wks and spots in her field of vision for 4 days. She states she has had a sense of doom "like (she) did with her last pregnancy when she was dx'd with PEC." She denies VB or LOF. She reports good (+) FM. She is very anxious. She has a h.o C/S 2 yrs ago for failure to progress; she's planning a RCS d/t not being dilated this time. She reports BH ctxs "for a while, but they have started being more noticeable since arriving in MAU." She receives [facility] with [facility]; next appt is 03/14/2023. Her spouse, Joyce Massey, is present and contributing to the history taking.    OB History     Gravida  3   Para  1   Term  1   Preterm  0   AB  1   Living  1      SAB  0   IAB  0   Ectopic  1   Multiple  0   Live Births  1        Obstetric Comments  Ectopic treated with Methotrexate         Past Medical History:  Diagnosis Date   ADHD (attention deficit hyperactivity disorder)    Anxiety    Eczema    Endometriosis, mild    GERD (gastroesophageal reflux disease)    History of gastric ulcer    Ovarian cyst    Pelvic pain     Past Surgical History:  Procedure Laterality Date   CESAREAN SECTION N/A 01/31/2021   Procedure: CESAREAN SECTION;  Surgeon: Mitchel Honour, DO;  Location: [facility];  Service: Obstetrics;  Laterality: N/A;   LAPAROSCOPIC CHOLECYSTECTOMY  2012  approx.   LAPAROSCOPIC OVARIAN CYSTECTOMY Left 07/12/2016   Procedure: OPERATIVE LAPAROSCOPIC OVARIAN CYSTECTOMY AND PERITONEAL BIOPSY;   Surgeon: Hal Morales, MD;  Location: [facility];  Service: Gynecology;  Laterality: Left;   WISDOM TOOTH EXTRACTION      Family History  Problem Relation Age of Onset   Healthy Mother    Hypertension Father    Heart disease Maternal Grandmother     Social History   Tobacco Use   Smoking status: Former    Current packs/day: 0.00    Average packs/day: 0.5 packs/day for 5.0 years (2.5 ttl pk-yrs)    Types: Cigarettes    Start date: 07/09/2009    Quit date: 07/09/2014    Years since quitting: 8.6   Smokeless tobacco: Never  Vaping Use   Vaping status: Never Used  Substance Use Topics   Alcohol use: Not Currently    Alcohol/week: 2.0 standard drinks of alcohol    Types: 2 Standard drinks or equivalent per week    Comment: occasional   Drug use: No    Allergies:  Allergies  Allergen Reactions   Wasp Venom Protein Swelling   Latex Rash   Tape Rash    Some tapes cause rashes (adhesives  may be the reason)    Medications Prior to Admission  Medication Sig Dispense Refill Last Dose   aspirin EC 81 MG tablet Take 81 mg by mouth daily. Swallow whole.   03/04/2023   famotidine (PEPCID) 20 MG tablet Take 20 mg by mouth 2 (two) times daily.   03/04/2023   fluticasone (FLONASE) 50 MCG/ACT nasal spray Place 1 spray into both nostrils daily.   Past Month   Prenatal Vit-Fe Fumarate-FA (PRENATAL VITAMIN PO) Take by mouth.   03/04/2023   sertraline (ZOLOFT) 50 MG tablet Take 50 mg by mouth daily.   03/04/2023    Review of Systems  Constitutional: Negative.   HENT: Negative.    Eyes:  Positive for visual disturbance ("blurry vision 1-2 wks; spots in vision x 4 days").  Respiratory:  Positive for shortness of breath.   Cardiovascular: Negative.   Gastrointestinal: Negative.   Endocrine: Negative.   Genitourinary:  Positive for pelvic pain (contractions).  Musculoskeletal: Negative.   Skin: Negative.   Allergic/Immunologic: Negative.   Neurological:  Positive  for headaches.  Hematological: Negative.   Psychiatric/Behavioral: Negative.     Physical Exam   Patient Vitals for the past 24 hrs:  BP Temp Temp src Pulse Resp SpO2 Height Weight  03/05/23 1305 -- -- -- -- -- 95 % -- --  03/05/23 1302 (!) 121/53 -- -- 83 -- -- -- --  03/05/23 1300 -- -- -- -- -- 96 % -- --  03/05/23 1255 -- -- -- -- -- 96 % -- --  03/05/23 1251 -- -- -- -- -- 97 % -- --  03/05/23 1246 125/80 -- -- 94 -- 99 % -- --  03/05/23 1240 -- -- -- -- -- 99 % -- --  03/05/23 1236 -- -- -- -- -- 99 % -- --  03/05/23 1230 118/84 -- -- 84 -- 99 % -- --  03/05/23 1226 -- -- -- -- -- 98 % -- --  03/05/23 1211 -- -- -- -- -- 99 % -- --  03/05/23 1206 -- -- -- -- -- 97 % -- --  03/05/23 1201 123/76 -- -- 90 -- 98 % -- --  03/05/23 1156 -- -- -- -- -- 97 % -- --  03/05/23 1151 -- -- -- -- -- 97 % -- --  03/05/23 1146 (!) 133/92 -- -- 96 -- 98 % -- --  03/05/23 1141 -- -- -- -- -- 98 % -- --  03/05/23 1136 -- -- -- -- -- 98 % -- --  03/05/23 1131 -- -- -- -- -- 98 % -- --  03/05/23 1126 -- -- -- -- -- 97 % -- --  03/05/23 1124 (!) 138/91 -- -- (!) 103 -- -- -- --  03/05/23 1121 -- -- -- -- -- 96 % -- --  03/05/23 1116 -- -- -- -- -- 97 % -- --  03/05/23 1057 (!) 153/95 98.2 F (36.8 C) Oral 95 16 99 % 5\' 7"  (1.702 m) 104.4 kg    Physical Exam Vitals and nursing note reviewed.  Constitutional:      Appearance: Normal appearance. She is obese.  HENT:     Head: Normocephalic and atraumatic.  Cardiovascular:     Rate and Rhythm: Normal rate and regular rhythm.     Pulses: Normal pulses.     Heart sounds: Normal heart sounds.  Pulmonary:     Effort: Pulmonary effort is normal.     Breath sounds: Normal breath sounds.  Abdominal:  General: Bowel sounds are normal.     Palpations: Abdomen is soft.  Genitourinary:    Comments: deferred Musculoskeletal:        General: Normal range of motion.     Right lower leg: No edema.     Left lower leg: No edema.  Skin:     General: Skin is warm and dry.  Neurological:     Mental Status: She is alert and oriented to person, place, and time.  Psychiatric:        Mood and Affect: Mood normal.        Behavior: Behavior normal.        Thought Content: Thought content normal.        Judgment: Judgment normal.    REACTIVE NST - FHR: 145 bpm / moderate variability / accels present / decels absent / TOCO: regular every 2-4 mins  MAU Course  Procedures  MDM CCUA CBC CMP P/C Ratio Serial BP's  Received headache cocktail (LR 1000 mL @ 999 mL/hr with Benadryl 25 mg IVP, Decadron 10 mg IVP, Compazine 10 mg IVP) -- reports relief -- H/A down to 2/10; "I'm just sleepy now".   Results for orders placed or performed during the hospital encounter of 03/05/23 (from the past 24 hour(s))  Protein / creatinine ratio, urine     Status: None   Collection Time: 03/05/23 10:49 AM  Result Value Ref Range   Creatinine, Urine 26 mg/dL   Total Protein, Urine <6 mg/dL   Protein Creatinine Ratio        0.00 - 0.15 mg/mg[Cre]  CBC     Status: Abnormal   Collection Time: 03/05/23 11:31 AM  Result Value Ref Range   WBC 9.7 4.0 - 10.5 K/uL   RBC 4.13 3.87 - 5.11 MIL/uL   Hemoglobin 13.6 12.0 - 15.0 g/dL   HCT 78.4 69.6 - 29.5 %   MCV 91.3 80.0 - 100.0 fL   MCH 32.9 26.0 - 34.0 pg   MCHC 36.1 (H) 30.0 - 36.0 g/dL   RDW 28.4 13.2 - 44.0 %   Platelets 222 150 - 400 K/uL   nRBC 0.0 0.0 - 0.2 %  Comprehensive metabolic panel     Status: Abnormal   Collection Time: 03/05/23 11:31 AM  Result Value Ref Range   Sodium 136 135 - 145 mmol/L   Potassium 4.0 3.5 - 5.1 mmol/L   Chloride 107 98 - 111 mmol/L   CO2 17 (L) 22 - 32 mmol/L   Glucose, Bld 101 (H) 70 - 99 mg/dL   BUN <5 (L) 6 - 20 mg/dL   Creatinine, Ser 1.02 0.44 - 1.00 mg/dL   Calcium 9.8 8.9 - 72.5 mg/dL   Total Protein 6.3 (L) 6.5 - 8.1 g/dL   Albumin 3.0 (L) 3.5 - 5.0 g/dL   AST 18 15 - 41 U/L   ALT 12 0 - 44 U/L   Alkaline Phosphatase 152 (H) 38 - 126 U/L    Total Bilirubin 0.5 0.3 - 1.2 mg/dL   GFR, Estimated >36 >64 mL/min   Anion gap 12 5 - 15      Assessment and Plan  1. Headache in pregnancy, antepartum, third trimester - Information provided on form for recording H/As - Advised to purchase [employer] brand that tension headache relief over-the-counter and take as directed on the container for headache. -Advised to call doctors office if headache is not relieved and associated with elevated blood pressures  2. Elevated blood pressure affecting  pregnancy in third trimester, antepartum - Information provided on HTN in pregnancy and form to record BPs   3. [redacted] weeks gestation of pregnancy   - Discharge patient - Keep scheduled appt with [facility] on Thursday 03/14/2023 - Patient verbalized an understanding of the plan of care and agrees.    Raelyn Mora, CNM 03/05/2023, 12:10 PM

## 2023-03-08 ENCOUNTER — Encounter (HOSPITAL_COMMUNITY): Payer: Self-pay

## 2023-03-08 NOTE — Patient Instructions (Addendum)
Joyce Massey  03/08/2023   Your procedure is scheduled on:  03/15/2023  Arrive at 0500 at [employer] [facility] on [address] at [facility]  and [employer]. You are invited to use the FREE valet parking or use the Visitor's parking deck.  Pick up the phone at the desk and dial [phone].  Call this number if you have problems the morning of surgery: [phone]  Remember:   Do not eat food:(After Midnight) Desps de medianoche.  Do not drink clear liquids: (4 Hours before arrival) 4 horas ante llegada.You may drink clear liquids until arrival at 0500.  Clear liquids means a liquid you can see thru.  It can have color such as Cola or Kool aid.  Tea is OK and coffee as long as no milk or creamer of any kind.  Take these medicines the morning of surgery with A SIP OF WATER:  Valtrex and zoloft as prescribed   Do not wear jewelry, make-up or nail polish.  Do not wear lotions, powders, or perfumes. Do not wear deodorant.  Do not shave 48 hours prior to surgery.  Do not bring valuables to the hospital.  [facility] is not   responsible for any belongings or valuables brought to the hospital.  Contacts, dentures or bridgework may not be worn into surgery.  Leave suitcase in the car. After surgery it may be brought to your room.  For patients admitted to the hospital, checkout time is 11:00 AM the day of              discharge.      Please read over the following fact sheets that you were given:     Preparing for Surgery

## 2023-03-11 LAB — OB RESULTS CONSOLE GBS: GBS: NEGATIVE

## 2023-03-11 NOTE — H&P (Signed)
Joyce Massey is a 32 y.o. female G3P1011 at 13 weeks presenting for repeat C/S for GHTN.  Patient has taken daily LDA this pregnancy for h/o GHTN.  Last u/s 10/1 with EFW 6#7 (>97%); large AC. Patient has h/o anxiety which has been well controlled on sertraline. Low risk NIPS.  GBS negative.    OB History     Gravida  3   Para  1   Term  1   Preterm  0   AB  1   Living  1      SAB  0   IAB  0   Ectopic  1   Multiple  0   Live Births  1        Obstetric Comments  Ectopic treated with Methotrexate        Past Medical History:  Diagnosis Date   ADHD (attention deficit hyperactivity disorder)    Anxiety    Eczema    Endometriosis, mild    GERD (gastroesophageal reflux disease)    History of gastric ulcer    History of pre-eclampsia in prior pregnancy, currently pregnant    Ovarian cyst    Pelvic pain    Past Surgical History:  Procedure Laterality Date   CESAREAN SECTION N/A 01/31/2021   Procedure: CESAREAN SECTION;  Surgeon: Mitchel Honour, DO;  Location: [facility];  Service: Obstetrics;  Laterality: N/A;   LAPAROSCOPIC CHOLECYSTECTOMY  2012  approx.   LAPAROSCOPIC OVARIAN CYSTECTOMY Left 07/12/2016   Procedure: OPERATIVE LAPAROSCOPIC OVARIAN CYSTECTOMY AND PERITONEAL BIOPSY;  Surgeon: Hal Morales, MD;  Location: [facility];  Service: Gynecology;  Laterality: Left;   WISDOM TOOTH EXTRACTION     Family History: family history includes Healthy in her mother; Heart disease in her maternal grandmother; Hypertension in her father. Social History:  reports that she quit smoking about 8 years ago. Her smoking use included cigarettes. She started smoking about 13 years ago. She has a 2.5 pack-year smoking history. She has never used smokeless tobacco. She reports that she does not currently use alcohol after a past usage of about 2.0 standard drinks of alcohol per week. She reports that she does not use drugs.     Maternal Diabetes: No Genetic  Screening: Normal Maternal Ultrasounds/Referrals: Normal Fetal Ultrasounds or other Referrals:  None Maternal Substance Abuse:  No Significant Maternal Medications:  Meds include: Zoloft Significant Maternal Lab Results:  Group B Strep negative Number of Prenatal Visits:greater than 3 verified prenatal visits Maternal Vaccinations:TDap Other Comments:  None  Review of Systems Maternal Medical History:  Prenatal Complications - Diabetes: none.     currently breastfeeding. Maternal Exam:  Abdomen: Patient reports no abdominal tenderness. Surgical scars: low transverse.   Fundal height is c/w dates.   Estimated fetal weight is 7#.     Physical Exam Constitutional:      Appearance: Normal appearance.  HENT:     Head: Normocephalic and atraumatic.  Pulmonary:     Effort: Pulmonary effort is normal.  Abdominal:     Palpations: Abdomen is soft.  Musculoskeletal:        General: Normal range of motion.     Cervical back: Normal range of motion.  Skin:    General: Skin is warm and dry.  Neurological:     Mental Status: She is alert and oriented to person, place, and time.  Psychiatric:        Mood and Affect: Mood normal.  Behavior: Behavior normal.     Prenatal labs: ABO, Rh:   Antibody: Negative (04/09 0000) Rubella: Immune (04/09 0000) RPR: Nonreactive (08/19 0000)  HBsAg: Negative (04/09 0000)  HIV: Non-reactive (04/09 0000)  GBS:   Negative  Assessment/Plan: 32yo G3P1011 at 37 weeks for repeat C/S, GHTN Patient has been counseled re: risk of bleeding, infection, scarring and damage to surrounding structures. She is informed of implications in future pregnancies to include placental abnormalities and uterine rupture.  All questions were answered and patient wishes to proceed.   Mitchel Honour 03/11/2023, 8:40 AM

## 2023-03-13 ENCOUNTER — Encounter (HOSPITAL_COMMUNITY)
Admission: RE | Admit: 2023-03-13 | Discharge: 2023-03-13 | Disposition: A | Discharge status: Home / Self Care | Payer: 59 | Source: Ambulatory Visit | Attending: Obstetrics & Gynecology | Admitting: Obstetrics & Gynecology

## 2023-03-13 DIAGNOSIS — Z98891 History of uterine scar from previous surgery: Secondary | ICD-10-CM

## 2023-03-13 DIAGNOSIS — Z01812 Encounter for preprocedural laboratory examination: Secondary | ICD-10-CM | POA: Insufficient documentation

## 2023-03-13 HISTORY — DX: Supervision of pregnancy with other poor reproductive or obstetric history, unspecified trimester: O09.299

## 2023-03-13 LAB — CBC
HCT: 37.1 % (ref 36.0–46.0)
Hemoglobin: 13.1 g/dL (ref 12.0–15.0)
MCH: 32.2 pg (ref 26.0–34.0)
MCHC: 35.3 g/dL (ref 30.0–36.0)
MCV: 91.2 fL (ref 80.0–100.0)
Platelets: 205 10*3/uL (ref 150–400)
RBC: 4.07 MIL/uL (ref 3.87–5.11)
RDW: 14.4 % (ref 11.5–15.5)
WBC: 8.3 10*3/uL (ref 4.0–10.5)
nRBC: 0 % (ref 0.0–0.2)

## 2023-03-13 LAB — TYPE AND SCREEN
ABO/RH(D): O POS
Antibody Screen: NEGATIVE

## 2023-03-14 LAB — RPR: RPR Ser Ql: NONREACTIVE

## 2023-03-15 ENCOUNTER — Encounter (HOSPITAL_COMMUNITY)
Admission: RE | Disposition: A | Discharge status: Home / Self Care | Payer: Self-pay | Source: Home / Self Care | Attending: Obstetrics & Gynecology

## 2023-03-15 ENCOUNTER — Inpatient Hospital Stay (HOSPITAL_COMMUNITY): Payer: 59 | Admitting: Anesthesiology

## 2023-03-15 ENCOUNTER — Other Ambulatory Visit: Payer: Self-pay

## 2023-03-15 ENCOUNTER — Inpatient Hospital Stay (HOSPITAL_COMMUNITY)
Admission: RE | Admit: 2023-03-15 | Discharge: 2023-03-17 | DRG: 788 | Disposition: A | Discharge status: Home / Self Care | Payer: 59 | Attending: Obstetrics & Gynecology | Admitting: Obstetrics & Gynecology

## 2023-03-15 ENCOUNTER — Encounter (HOSPITAL_COMMUNITY): Payer: Self-pay | Admitting: Obstetrics & Gynecology

## 2023-03-15 DIAGNOSIS — Z8711 Personal history of peptic ulcer disease: Secondary | ICD-10-CM

## 2023-03-15 DIAGNOSIS — O9962 Diseases of the digestive system complicating childbirth: Secondary | ICD-10-CM | POA: Diagnosis present

## 2023-03-15 DIAGNOSIS — Z8249 Family history of ischemic heart disease and other diseases of the circulatory system: Secondary | ICD-10-CM

## 2023-03-15 DIAGNOSIS — O34211 Maternal care for low transverse scar from previous cesarean delivery: Secondary | ICD-10-CM | POA: Diagnosis present

## 2023-03-15 DIAGNOSIS — O99344 Other mental disorders complicating childbirth: Secondary | ICD-10-CM | POA: Diagnosis present

## 2023-03-15 DIAGNOSIS — Z98891 History of uterine scar from previous surgery: Principal | ICD-10-CM

## 2023-03-15 DIAGNOSIS — Z9049 Acquired absence of other specified parts of digestive tract: Secondary | ICD-10-CM | POA: Diagnosis not present

## 2023-03-15 DIAGNOSIS — K219 Gastro-esophageal reflux disease without esophagitis: Secondary | ICD-10-CM | POA: Diagnosis present

## 2023-03-15 DIAGNOSIS — F419 Anxiety disorder, unspecified: Secondary | ICD-10-CM | POA: Diagnosis present

## 2023-03-15 DIAGNOSIS — O134 Gestational [pregnancy-induced] hypertension without significant proteinuria, complicating childbirth: Secondary | ICD-10-CM | POA: Diagnosis present

## 2023-03-15 DIAGNOSIS — Z3A37 37 weeks gestation of pregnancy: Secondary | ICD-10-CM | POA: Diagnosis not present

## 2023-03-15 DIAGNOSIS — Z87891 Personal history of nicotine dependence: Secondary | ICD-10-CM | POA: Diagnosis not present

## 2023-03-15 DIAGNOSIS — O99214 Obesity complicating childbirth: Secondary | ICD-10-CM | POA: Diagnosis present

## 2023-03-15 SURGERY — Surgical Case
Anesthesia: Monitor Anesthesia Care | Site: Abdomen

## 2023-03-15 MED ORDER — SERTRALINE HCL 50 MG PO TABS
75.0000 mg | ORAL_TABLET | Freq: Every day | ORAL | Status: DC
  Administered 2023-03-15 – 2023-03-17 (×3): 75 mg via ORAL
  Filled 2023-03-15 (×3): qty 1

## 2023-03-15 MED ORDER — HYDROMORPHONE HCL 1 MG/ML IJ SOLN
INTRAMUSCULAR | Status: AC
  Filled 2023-03-15: qty 0.5

## 2023-03-15 MED ORDER — SIMETHICONE 80 MG PO CHEW: 80.0000 mg | CHEWABLE_TABLET | ORAL | Status: DC | PRN

## 2023-03-15 MED ORDER — ACETAMINOPHEN 10 MG/ML IV SOLN
INTRAVENOUS | Status: DC | PRN
  Administered 2023-03-15: 1000 mg via INTRAVENOUS

## 2023-03-15 MED ORDER — KETOROLAC TROMETHAMINE 30 MG/ML IJ SOLN
30.0000 mg | Freq: Once | INTRAMUSCULAR | Status: AC | PRN
  Administered 2023-03-15: 30 mg via INTRAVENOUS

## 2023-03-15 MED ORDER — DEXAMETHASONE SODIUM PHOSPHATE 4 MG/ML IJ SOLN
INTRAMUSCULAR | Status: AC
  Filled 2023-03-15: qty 2

## 2023-03-15 MED ORDER — CEFAZOLIN SODIUM-DEXTROSE 2-4 GM/100ML-% IV SOLN
2.0000 g | INTRAVENOUS | Status: AC
  Administered 2023-03-15: 2 g via INTRAVENOUS

## 2023-03-15 MED ORDER — ZOLPIDEM TARTRATE 5 MG PO TABS: 5.0000 mg | ORAL_TABLET | Freq: Every evening | ORAL | Status: DC | PRN

## 2023-03-15 MED ORDER — IBUPROFEN 600 MG PO TABS
600.0000 mg | ORAL_TABLET | Freq: Four times a day (QID) | ORAL | Status: DC
  Administered 2023-03-15 – 2023-03-17 (×8): 600 mg via ORAL
  Filled 2023-03-15 (×8): qty 1

## 2023-03-15 MED ORDER — FENTANYL CITRATE (PF) 100 MCG/2ML IJ SOLN
INTRAMUSCULAR | Status: AC
  Filled 2023-03-15: qty 2

## 2023-03-15 MED ORDER — ONDANSETRON HCL 4 MG/2ML IJ SOLN: 4.0000 mg | Freq: Once | INTRAMUSCULAR | Status: DC | PRN

## 2023-03-15 MED ORDER — FENTANYL CITRATE (PF) 100 MCG/2ML IJ SOLN
INTRAMUSCULAR | Status: DC | PRN
  Administered 2023-03-15: 15 ug via INTRATHECAL

## 2023-03-15 MED ORDER — MEPERIDINE HCL 25 MG/ML IJ SOLN: 6.2500 mg | INTRAMUSCULAR | Status: DC | PRN

## 2023-03-15 MED ORDER — PHENYLEPHRINE HCL-NACL 20-0.9 MG/250ML-% IV SOLN
INTRAVENOUS | Status: AC
  Filled 2023-03-15: qty 250

## 2023-03-15 MED ORDER — SIMETHICONE 80 MG PO CHEW
80.0000 mg | CHEWABLE_TABLET | Freq: Three times a day (TID) | ORAL | Status: DC
  Administered 2023-03-15 – 2023-03-17 (×7): 80 mg via ORAL
  Filled 2023-03-15 (×7): qty 1

## 2023-03-15 MED ORDER — BUPIVACAINE IN DEXTROSE 0.75-8.25 % IT SOLN
INTRATHECAL | Status: DC | PRN
  Administered 2023-03-15: 1.6 mL via INTRATHECAL

## 2023-03-15 MED ORDER — MORPHINE SULFATE (PF) 0.5 MG/ML IJ SOLN
INTRAMUSCULAR | Status: AC
  Filled 2023-03-15: qty 10

## 2023-03-15 MED ORDER — HYDROMORPHONE HCL 1 MG/ML IJ SOLN
0.2500 mg | INTRAMUSCULAR | Status: DC | PRN
  Administered 2023-03-15: 0.5 mg via INTRAVENOUS

## 2023-03-15 MED ORDER — PRENATAL MULTIVITAMIN CH
1.0000 | ORAL_TABLET | Freq: Every day | ORAL | Status: DC
  Filled 2023-03-15 (×2): qty 1

## 2023-03-15 MED ORDER — PHENYLEPHRINE HCL-NACL 20-0.9 MG/250ML-% IV SOLN
INTRAVENOUS | Status: DC | PRN
  Administered 2023-03-15: 30 ug/min via INTRAVENOUS

## 2023-03-15 MED ORDER — LIDOCAINE HCL (PF) 1 % IJ SOLN
INTRAMUSCULAR | Status: AC
  Filled 2023-03-15: qty 5

## 2023-03-15 MED ORDER — NIFEDIPINE ER OSMOTIC RELEASE 30 MG PO TB24
30.0000 mg | ORAL_TABLET | Freq: Every day | ORAL | Status: DC
  Administered 2023-03-15 – 2023-03-16 (×2): 30 mg via ORAL
  Filled 2023-03-15 (×2): qty 1

## 2023-03-15 MED ORDER — ONDANSETRON HCL 4 MG/2ML IJ SOLN
INTRAMUSCULAR | Status: AC
  Filled 2023-03-15: qty 2

## 2023-03-15 MED ORDER — OXYTOCIN-SODIUM CHLORIDE 30-0.9 UT/500ML-% IV SOLN
INTRAVENOUS | Status: DC | PRN
  Administered 2023-03-15: 300 mL via INTRAVENOUS

## 2023-03-15 MED ORDER — SENNOSIDES-DOCUSATE SODIUM 8.6-50 MG PO TABS
2.0000 | ORAL_TABLET | Freq: Every day | ORAL | Status: DC
  Administered 2023-03-16 – 2023-03-17 (×2): 2 via ORAL
  Filled 2023-03-15 (×2): qty 2

## 2023-03-15 MED ORDER — MORPHINE SULFATE (PF) 0.5 MG/ML IJ SOLN
INTRAMUSCULAR | Status: DC | PRN
  Administered 2023-03-15: 150 ug via INTRATHECAL

## 2023-03-15 MED ORDER — OXYTOCIN-SODIUM CHLORIDE 30-0.9 UT/500ML-% IV SOLN
2.5000 [IU]/h | INTRAVENOUS | Status: AC
  Administered 2023-03-15: 2.5 [IU]/h via INTRAVENOUS
  Filled 2023-03-15: qty 500

## 2023-03-15 MED ORDER — DIPHENHYDRAMINE HCL 25 MG PO CAPS: 25.0000 mg | ORAL_CAPSULE | Freq: Four times a day (QID) | ORAL | Status: DC | PRN

## 2023-03-15 MED ORDER — DEXAMETHASONE SODIUM PHOSPHATE 4 MG/ML IJ SOLN
INTRAMUSCULAR | Status: DC | PRN
  Administered 2023-03-15: 8 mg via INTRAVENOUS

## 2023-03-15 MED ORDER — OXYTOCIN-SODIUM CHLORIDE 30-0.9 UT/500ML-% IV SOLN
INTRAVENOUS | Status: AC
  Filled 2023-03-15: qty 500

## 2023-03-15 MED ORDER — LACTATED RINGERS IV SOLN: INTRAVENOUS | Status: DC

## 2023-03-15 MED ORDER — MENTHOL 3 MG MT LOZG
1.0000 | LOZENGE | OROMUCOSAL | Status: DC | PRN
Start: 2023-03-15 — End: 2023-03-17

## 2023-03-15 MED ORDER — LACTATED RINGERS IV SOLN: INTRAVENOUS | Status: AC

## 2023-03-15 MED ORDER — FLUTICASONE PROPIONATE 50 MCG/ACT NA SUSP: 1.0000 | Freq: Every day | NASAL | Status: DC | PRN

## 2023-03-15 MED ORDER — POVIDONE-IODINE 10 % EX SWAB
2.0000 | Freq: Once | CUTANEOUS | Status: AC
  Administered 2023-03-15: 2 via TOPICAL

## 2023-03-15 MED ORDER — DEXMEDETOMIDINE HCL IN NACL 80 MCG/20ML IV SOLN
INTRAVENOUS | Status: AC
  Filled 2023-03-15: qty 20

## 2023-03-15 MED ORDER — ACETAMINOPHEN 10 MG/ML IV SOLN
INTRAVENOUS | Status: AC
  Filled 2023-03-15: qty 100

## 2023-03-15 MED ORDER — WITCH HAZEL-GLYCERIN EX PADS: 1.0000 | MEDICATED_PAD | CUTANEOUS | Status: DC | PRN

## 2023-03-15 MED ORDER — OXYCODONE HCL 5 MG PO TABS
5.0000 mg | ORAL_TABLET | ORAL | Status: DC | PRN
  Administered 2023-03-15: 10 mg via ORAL
  Administered 2023-03-16: 5 mg via ORAL
  Administered 2023-03-16: 10 mg via ORAL
  Administered 2023-03-16: 5 mg via ORAL
  Administered 2023-03-16 – 2023-03-17 (×7): 10 mg via ORAL
  Filled 2023-03-15 (×8): qty 2
  Filled 2023-03-15: qty 1
  Filled 2023-03-15: qty 2
  Filled 2023-03-15: qty 1

## 2023-03-15 MED ORDER — DIBUCAINE (PERIANAL) 1 % EX OINT: 1.0000 | TOPICAL_OINTMENT | CUTANEOUS | Status: DC | PRN

## 2023-03-15 MED ORDER — SODIUM CHLORIDE 0.9 % IR SOLN
Status: DC | PRN
  Administered 2023-03-15: 1

## 2023-03-15 MED ORDER — AMISULPRIDE (ANTIEMETIC) 5 MG/2ML IV SOLN: 10.0000 mg | Freq: Once | INTRAVENOUS | Status: DC | PRN

## 2023-03-15 MED ORDER — COCONUT OIL OIL: 1.0000 | TOPICAL_OIL | Status: DC | PRN

## 2023-03-15 MED ORDER — OXYCODONE HCL 5 MG/5ML PO SOLN: 5.0000 mg | Freq: Once | ORAL | Status: DC | PRN

## 2023-03-15 MED ORDER — OXYCODONE HCL 5 MG PO TABS: 5.0000 mg | ORAL_TABLET | Freq: Once | ORAL | Status: DC | PRN

## 2023-03-15 MED ORDER — PHENYLEPHRINE 80 MCG/ML (10ML) SYRINGE FOR IV PUSH (FOR BLOOD PRESSURE SUPPORT)
PREFILLED_SYRINGE | INTRAVENOUS | Status: AC
  Filled 2023-03-15: qty 10

## 2023-03-15 MED ORDER — STERILE WATER FOR IRRIGATION IR SOLN
Status: DC | PRN
  Administered 2023-03-15: 1

## 2023-03-15 MED ORDER — FAMOTIDINE 20 MG PO TABS
20.0000 mg | ORAL_TABLET | Freq: Two times a day (BID) | ORAL | Status: DC
  Administered 2023-03-15 – 2023-03-17 (×5): 20 mg via ORAL
  Filled 2023-03-15 (×6): qty 1

## 2023-03-15 MED ORDER — ONDANSETRON HCL 4 MG/2ML IJ SOLN
INTRAMUSCULAR | Status: DC | PRN
  Administered 2023-03-15: 4 mg via INTRAVENOUS

## 2023-03-15 MED ORDER — CEFAZOLIN SODIUM-DEXTROSE 2-4 GM/100ML-% IV SOLN
INTRAVENOUS | Status: AC
  Filled 2023-03-15: qty 100

## 2023-03-15 MED ORDER — ACETAMINOPHEN 500 MG PO TABS
1000.0000 mg | ORAL_TABLET | Freq: Four times a day (QID) | ORAL | Status: DC
Start: 2023-03-15 — End: 2023-03-17
  Administered 2023-03-15 – 2023-03-17 (×8): 1000 mg via ORAL
  Filled 2023-03-15 (×9): qty 2

## 2023-03-15 SURGICAL SUPPLY — 36 items
ADH SKN CLS APL DERMABOND .7 (GAUZE/BANDAGES/DRESSINGS)
APL PRP STRL LF DISP 70% ISPRP (MISCELLANEOUS) ×2
APL SKNCLS STERI-STRIP NONHPOA (GAUZE/BANDAGES/DRESSINGS) ×1
BENZOIN TINCTURE PRP APPL 2/3 (GAUZE/BANDAGES/DRESSINGS) ×1 IMPLANT
CHLORAPREP W/TINT 26 (MISCELLANEOUS) ×2 IMPLANT
CLAMP UMBILICAL CORD (MISCELLANEOUS) ×1 IMPLANT
CLOTH BEACON ORANGE TIMEOUT ST (SAFETY) ×1 IMPLANT
DERMABOND ADVANCED .7 DNX12 (GAUZE/BANDAGES/DRESSINGS) IMPLANT
DRSG OPSITE POSTOP 4X10 (GAUZE/BANDAGES/DRESSINGS) ×1 IMPLANT
ELECT REM PT RETURN 9FT ADLT (ELECTROSURGICAL) ×1
ELECTRODE REM PT RTRN 9FT ADLT (ELECTROSURGICAL) ×1 IMPLANT
EXTRACTOR VACUUM KIWI (MISCELLANEOUS) IMPLANT
GAUZE PAD ABD 7.5X8 STRL (GAUZE/BANDAGES/DRESSINGS) IMPLANT
GAUZE SPONGE 4X4 12PLY STRL LF (GAUZE/BANDAGES/DRESSINGS) IMPLANT
GLOVE BIO SURGEON STRL SZ 6 (GLOVE) ×1 IMPLANT
GLOVE BIOGEL PI IND STRL 6 (GLOVE) ×2 IMPLANT
GLOVE BIOGEL PI IND STRL 7.0 (GLOVE) ×1 IMPLANT
GOWN STRL REUS W/TWL LRG LVL3 (GOWN DISPOSABLE) ×2 IMPLANT
KIT ABG SYR 3ML LUER SLIP (SYRINGE) ×1 IMPLANT
MAT PREVALON FULL STRYKER (MISCELLANEOUS) IMPLANT
NDL HYPO 25X5/8 SAFETYGLIDE (NEEDLE) ×1 IMPLANT
NEEDLE HYPO 25X5/8 SAFETYGLIDE (NEEDLE) ×1
NS IRRIG 1000ML POUR BTL (IV SOLUTION) ×1 IMPLANT
PACK C SECTION WH (CUSTOM PROCEDURE TRAY) ×1 IMPLANT
PAD OB MATERNITY 4.3X12.25 (PERSONAL CARE ITEMS) ×1 IMPLANT
STRIP CLOSURE SKIN 1/2X4 (GAUZE/BANDAGES/DRESSINGS) IMPLANT
SUT CHROMIC 0 CTX 36 (SUTURE) ×3 IMPLANT
SUT MON AB 2-0 CT1 27 (SUTURE) ×1 IMPLANT
SUT PDS AB 0 CT1 27 (SUTURE) IMPLANT
SUT PLAIN 0 NONE (SUTURE) IMPLANT
SUT VIC AB 0 CT1 36 (SUTURE) IMPLANT
SUT VIC AB 4-0 KS 27 (SUTURE) IMPLANT
TAPE CLOTH SURG 4X10 WHT LF (GAUZE/BANDAGES/DRESSINGS) IMPLANT
TOWEL OR 17X24 6PK STRL BLUE (TOWEL DISPOSABLE) ×1 IMPLANT
TRAY FOLEY W/BAG SLVR 14FR LF (SET/KITS/TRAYS/PACK) IMPLANT
WATER STERILE IRR 1000ML POUR (IV SOLUTION) ×1 IMPLANT

## 2023-03-15 NOTE — Anesthesia Procedure Notes (Signed)
Spinal  Patient location during procedure: OR Start time: 03/15/2023 7:45 AM End time: 03/15/2023 7:59 AM Reason for block: surgical anesthesia Staffing Performed: anesthesiologist  Anesthesiologist: Elmer Picker, MD Performed by: Elmer Picker, MD Authorized by: Elmer Picker, MD   Preanesthetic Checklist Completed: patient identified, IV checked, risks and benefits discussed, surgical consent, monitors and equipment checked, pre-op evaluation and timeout performed Spinal Block Patient position: sitting Prep: DuraPrep and site prepped and draped Patient monitoring: cardiac monitor, continuous pulse ox and blood pressure Approach: midline Location: L3-4 Injection technique: single-shot Needle Needle type: Tuohy  Needle gauge: 25 G Needle length: 9 cm Assessment Sensory level: T6 Events: CSF return Additional Notes Functioning IV was confirmed and monitors were applied. Sterile prep and drape, including hand hygiene and sterile gloves were used. The patient was positioned and the spine was prepped. The skin was anesthetized with lidocaine.  Free flow of clear CSF was obtained prior to injecting local anesthetic into the CSF.  The spinal needle aspirated freely following injection.  The needle was carefully withdrawn.  The patient tolerated the procedure well.   Difficult spinal placement. Attempts at L2/3 and L3/4. Success at L3/4 with Tuohy and 25G Pencan needle. Midline is ~1cm to the right of what it appears to be after visualization and palpation.

## 2023-03-15 NOTE — Progress Notes (Signed)
The Night shift Rn called Dr. Lorane Gell about patient's BP of  138/92 after procardia was given at 1815. No further orders were given. Per MD , vital signs can be taken again around 22:00. Call MD for parameters of 160/110.

## 2023-03-15 NOTE — Anesthesia Postprocedure Evaluation (Signed)
Anesthesia Post Note  Patient: Joyce Massey  Procedure(s) Performed: REPEAT CESAREAN SECTION EDC: 04-04-23 PREVIOUS X 1  ALLERG: ADHESIVE TAP, LATEX (Abdomen)     Patient location during evaluation: PACU Anesthesia Type: Spinal Level of consciousness: oriented and awake and alert Pain management: pain level controlled Vital Signs Assessment: post-procedure vital signs reviewed and stable Respiratory status: spontaneous breathing, respiratory function stable and patient connected to nasal cannula oxygen Cardiovascular status: blood pressure returned to baseline and stable Postop Assessment: no headache, no backache, no apparent nausea or vomiting and spinal receding Anesthetic complications: no  No notable events documented.  Last Vitals:  Vitals:   03/15/23 1150 03/15/23 1350  BP: (!) 138/95 (!) 128/91  Pulse: 66 64  Resp: 18 18  Temp: 37.1 C 37.1 C  SpO2: 98% 94%    Last Pain:  Vitals:   03/15/23 1429  TempSrc:   PainSc: 0-No pain   Pain Goal: Patients Stated Pain Goal: 4 (03/15/23 1013)                 Melo Stauber L Sheilla Maris

## 2023-03-15 NOTE — Anesthesia Preprocedure Evaluation (Addendum)
Anesthesia Evaluation  Patient identified by MRN, date of birth, ID band Patient awake    Reviewed: Allergy & Precautions, NPO status , Patient's Chart, lab work & pertinent test results  Airway Mallampati: II  TM Distance: >3 FB Neck ROM: Full    Dental no notable dental hx. (+) Teeth Intact, Dental Advisory Given   Pulmonary former smoker   Pulmonary exam normal breath sounds clear to auscultation       Cardiovascular hypertension (no formal diagnosis, preop NIBP 156/104), Normal cardiovascular exam Rhythm:Regular Rate:Normal     Neuro/Psych  PSYCHIATRIC DISORDERS Anxiety     negative neurological ROS     GI/Hepatic Neg liver ROS,GERD  Medicated and Controlled,,  Endo/Other  Obesity BMI 37  Renal/GU negative Renal ROS  negative genitourinary   Musculoskeletal negative musculoskeletal ROS (+)    Abdominal  (+) + obese  Peds negative pediatric ROS (+)  Hematology negative hematology ROS (+) Hb 13.1, plt 205   Anesthesia Other Findings   Reproductive/Obstetrics (+) Pregnancy 1 prior section                             Anesthesia Physical Anesthesia Plan  ASA: 2  Anesthesia Plan: Spinal and MAC   Post-op Pain Management: Ofirmev IV (intra-op)* and Toradol IV (intra-op)*   Induction:   PONV Risk Score and Plan: 2 and Ondansetron, Dexamethasone and Treatment may vary due to age or medical condition  Airway Management Planned: Natural Airway and Nasal Cannula  Additional Equipment: None  Intra-op Plan:   Post-operative Plan:   Informed Consent: I have reviewed the patients History and Physical, chart, labs and discussed the procedure including the risks, benefits and alternatives for the proposed anesthesia with the patient or authorized representative who has indicated his/her understanding and acceptance.       Plan Discussed with: CRNA  Anesthesia Plan Comments:         Anesthesia Quick Evaluation

## 2023-03-15 NOTE — Transfer of Care (Signed)
Immediate Anesthesia Transfer of Care Note  Patient: Joyce Massey  Procedure(s) Performed: REPEAT CESAREAN SECTION EDC: 04-04-23 PREVIOUS X 1  ALLERG: ADHESIVE TAP, LATEX (Abdomen)  Patient Location: PACU  Anesthesia Type:Spinal  Level of Consciousness: awake, alert , and oriented  Airway & Oxygen Therapy: Patient Spontanous Breathing  Post-op Assessment: Report given to RN and Post -op Vital signs reviewed and stable  Post vital signs: Reviewed and stable  Last Vitals:  Vitals Value Taken Time  BP 156/88   Temp    Pulse 78   Resp 20   SpO2 97     Last Pain:  Vitals:   03/15/23 0527  TempSrc: Oral  PainSc: 2          Complications: No notable events documented.

## 2023-03-15 NOTE — Progress Notes (Signed)
Spoke with Dr. Lorane Gell regarding patients BP. New order for Procardia. Parameters to call back 160/110

## 2023-03-15 NOTE — Plan of Care (Signed)
 [facility] Tonsillectomy/Adenoidectomy, Postoperative PEDS care plan entered in error.

## 2023-03-15 NOTE — Op Note (Signed)
[name] PROCEDURE DATE: 03/15/2023  PREOPERATIVE DIAGNOSIS: Intrauterine pregnancy at  [redacted]w[redacted]d weeks gestation, previous cesarean delivery, GHTN  POSTOPERATIVE DIAGNOSIS: The same  PROCEDURE:  Repeat Low Transverse Cesarean Section  SURGEON:  Dr. Mitchel Honour  INDICATIONS: Joyce Massey is a 32 y.o. G3P1011 at [redacted]w[redacted]d scheduled for cesarean section secondary to desire for repeat and GHTN.  The risks of cesarean section discussed with the patient included but were not limited to: bleeding which may require transfusion or reoperation; infection which may require antibiotics; injury to bowel, bladder, ureters or other surrounding organs; injury to the fetus; need for additional procedures including hysterectomy in the event of a life-threatening hemorrhage; placental abnormalities wth subsequent pregnancies, incisional problems, thromboembolic phenomenon and other postoperative/anesthesia complications. The patient concurred with the proposed plan, giving informed written consent for the procedure.    FINDINGS:  Viable female infant in cephalic presentation, APGARs 7,8: weight 8#11.3 Clear amniotic fluid.  Intact placenta, three vessel cord.  Grossly normal uterus, ovaries and fallopian tubes. .   ANESTHESIA:  Spinal ESTIMATED BLOOD LOSS: 319 ml SPECIMENS: Placenta sent to L&D COMPLICATIONS: None immediate  PROCEDURE IN DETAIL:  The patient received intravenous antibiotics and had sequential compression devices applied to her lower extremities while in the preoperative area.  She was then taken to the operating room where spinal anesthesia was administered and was found to be adequate. She was then placed in a dorsal supine position with a leftward tilt, and prepped and draped in a sterile manner.  A foley catheter was placed into her bladder and attached to constant gravity.  After an adequate timeout was performed, a Pfannenstiel skin incision was made with scalpel and carried through to the  underlying layer of fascia. The fascia was incised in the midline and this incision was extended bilaterally using the Mayo scissors. Kocher clamps were applied to the superior aspect of the fascial incision and the underlying rectus muscles were dissected off bluntly. A similar process was carried out on the inferior aspect of the facial incision. The rectus muscles were separated in the midline bluntly and the peritoneum was entered bluntly.  Bladder flap was created sharply and developed bluntly.  Bladder blade was placed.  A transverse hysterotomy was made with a scalpel and extended bilaterally bluntly. The bladder blade was then removed. The infant was successfully delivered, and cord was clamped and cut and infant was handed over to awaiting neonatology team. Uterine massage was then administered and the placenta delivered intact with three-vessel cord. The uterus was cleared of clot and debris.  The hysterotomy was closed with 0 chromic.  A second imbricating suture of 0-chromic was used to reinforce the incision and aid in hemostasis.  The peritoneum and rectus muscles were noted to be hemostatic and were reapproximated using 2-0 monocryl in a running fashion.  The fascia was closed with 0-PDS in a running fashion with good restoration of anatomy.  The subcutaneus tissue was copiously irrigated.  The skin was closed with 4-0 vicryl in a subcuticular fashion.  Pt tolerated the procedure will.  All counts were correct x2.  Pt went to the recovery room in stable condition.

## 2023-03-15 NOTE — Progress Notes (Signed)
No change to H&P.  Joyce Cavazos, DO 

## 2023-03-15 NOTE — Lactation Note (Addendum)
This note was copied from a baby's chart. Lactation Consultation Note  Patient Name: Joyce Massey Today's Date: 03/15/2023 Age:33 hours Reason for consult: Initial assessment;Early term 37-38.6wks.  LC did not observe latch, infant recently BF for 15 minutes and afterwards infant was given 4 mls of EBM that mom expressed using her personal hukka hand pump. MOB feels infant is latching well, infant has BF three times since birth without difficulty  and is sustaining her latch. Very different from her 1st child. LC observed that MOB nipples are evert outward and she does  not need NS at this time. She plans to continue to latch infant at the breast every 2 to 3 hours, skin to skin, plans to offer EBM with feedings this is her feeding plan.MOB knows to call RN/LC for latch assistance if needed. LC discussed importance of maternal rest, diet and hydration. LC discussed infant's input and output. MOB was made aware of O/P services, breastfeeding support groups, community resources, and our phone # for post-discharge questions.    Today's feeding plan" 1- Continue to BF by cues, on demand, every 2-3 hours, skin to skin. 2- Continue to wear breast shells in bra when not sleeping. 3- Her choice plans to offer EBM after latching infant at the breast, either hand express or colostrum collected by [facility]. Maternal Data Has patient been taught Hand Expression?: Yes Does the patient have breastfeeding experience prior to this delivery?: Yes How long did the patient breastfeed?: Per MOB, had latch difficulties with latching 1st child due flat nipples, used NS and breast shells, after 2 weeks she decided to exclussivly Pump, she pumped for 1 year.  Feeding Mother's Current Feeding Choice: Breast Milk  LATCH Score  LC did not observe latch at this time.                   Lactation Tools Discussed/Used Tools: Shells (MOB brough her personal shells from home and wearing in her bra when not  sleeping.)  Interventions Interventions: Breast feeding basics reviewed;Skin to skin;Breast compression;DEBP;Shells;Expressed milk;Education;LC Services brochure  Discharge Pump: DEBP;Personal  Consult Status      Frederico Hamman 03/15/2023, 3:59 PM

## 2023-03-16 ENCOUNTER — Encounter (HOSPITAL_COMMUNITY): Payer: Self-pay | Admitting: Obstetrics & Gynecology

## 2023-03-16 LAB — CBC
HCT: 31.4 % — ABNORMAL LOW (ref 36.0–46.0)
Hemoglobin: 11.1 g/dL — ABNORMAL LOW (ref 12.0–15.0)
MCH: 32.6 pg (ref 26.0–34.0)
MCHC: 35.4 g/dL (ref 30.0–36.0)
MCV: 92.4 fL (ref 80.0–100.0)
Platelets: 147 10*3/uL — ABNORMAL LOW (ref 150–400)
RBC: 3.4 MIL/uL — ABNORMAL LOW (ref 3.87–5.11)
RDW: 14.2 % (ref 11.5–15.5)
WBC: 11.6 10*3/uL — ABNORMAL HIGH (ref 4.0–10.5)
nRBC: 0 % (ref 0.0–0.2)

## 2023-03-16 NOTE — Lactation Note (Signed)
This note was copied from a baby's chart. Lactation Consultation Note  Patient Name: Joyce Massey Today's Date: 03/16/2023 Age:33 hours Reason for consult: Follow-up assessment;Mother's request;Difficult latch.  LC assisted with latching infant at the breast, MOB latched infant on her right breast using the football hold position with pillow support, infant latched with depth and was still BF when LC left the room, swallows were heard while infant was BF, infant was still BF after 12 minutes when LC left the room. MOB will continue to work on latching infant at the breast, BF by cues, on demand, every 2 -3 hours ,skin to skin. MOB knows to continue to ask RN/LC for latch assistance if needed.   Maternal Data    Feeding Mother's Current Feeding Choice: Breast Milk  LATCH Score Latch: Grasps breast easily, tongue down, lips flanged, rhythmical sucking.  Audible Swallowing: A few with stimulation  Type of Nipple: Everted at rest and after stimulation  Comfort (Breast/Nipple): Soft / non-tender  Hold (Positioning): Assistance needed to correctly position infant at breast and maintain latch.  LATCH Score: 8   Lactation Tools Discussed/Used    Interventions Interventions: Assisted with latch;Skin to skin;Breast compression;Adjust position;Support pillows;Position options  Discharge    Consult Status Consult Status: Follow-up Date: 03/16/23 Follow-up type: In-patient    Frederico Hamman 03/16/2023, 2:22 AM

## 2023-03-16 NOTE — Progress Notes (Signed)
MOB was referred for history of anxiety.  * Referral screened out by Clinical Social Worker because none of the following criteria appear to apply:  ~ History of anxiety during this pregnancy, or of post-partum depression following prior delivery.  ~ Diagnosis of anxiety within last 3 years  OR  * MOB's symptoms currently being treated with medication and/or therapy.  Per OB notes, MOB is prescribed Zoloft 50mg  for symptoms.  Please contact the Clinical Social Worker if needs arise, by [facility] request, or if MOB scores greater than 9/yes to question 10 on Edinburgh Postpartum Depression Screen.  Enos Fling, Theresia Majors Clinical Social Worker [phone]

## 2023-03-16 NOTE — Lactation Note (Signed)
This note was copied from a baby's chart. Lactation Consultation Note  Patient Name: Girl Keliah Vanhoose Today's Date: 03/16/2023 Age:33 hours Reason for consult: Follow-up assessment;Early term 37-38.6wks (weight loss -3.04%) P2, ETI female infant, Per MOB infant is breastfeeding well and latching on both breast for feedings without difficulties. Infant had 2 voids and 3 stools today. MOB is feeding on demand, every 2-3 hours, skin to skin. MOB knows to call if latch assistance is needed. Due to cluster feeding after latching MOB sometimes  hand express or uses Hukka and give back extra volume. LC did not observe recent latch due infant finished BF for 3) just prior to [facility] entering the room.   Maternal Data    Feeding    LATCH Score                    Lactation Tools Discussed/Used    Interventions Interventions: Education;Expressed milk;Breast compression  Discharge    Consult Status Consult Status: Follow-up Date: 03/17/23 Follow-up type: In-patient    Frederico Hamman 03/16/2023, 4:25 PM

## 2023-03-16 NOTE — Progress Notes (Signed)
Postpartum Progress Note  Postpartum Day 1 s/p repeat Cesarean section at 37 weeks for gestational hypertension  Subjective:  Patient reports no overnight events.  She reports well controlled pain, ambulating without difficulty, voiding spontaneously, tolerating PO.  She reports Positive flatus, Negative BM.  Vaginal bleeding is minimal.  Objective: Blood pressure 130/77, pulse 68, temperature 98.2 F (36.8 C), temperature source Oral, resp. rate 18, height 5\' 7"  (1.702 m), weight 107.2 kg, SpO2 97%, unknown if currently breastfeeding.  Physical Exam:  General: alert and no distress Lochia: appropriate Abdomen: soft, ATTP Uterine Fundus: firm Incision: dressing in place DVT Evaluation: No evidence of DVT seen on physical exam.  Recent Labs    03/13/23 0923 03/16/23 0445  HGB 13.1 11.1*  HCT 37.1 31.4*    Assessment/Plan: Postpartum Day 1, s/p C-section gHTN: BP with persistent mild range yesterday.  Evening dose of procardia XL 30 mg at bedtime given last night. Labetalol not started due to pulse in 60s.  BP improved this AM.  Lactation following Doing well, continue routine postpartum care. Anticipate discharge PPD#2 or 3   LOS: 1 day   Lyn Henri 03/16/2023, 7:35 AM

## 2023-03-17 MED ORDER — OXYCODONE HCL 5 MG PO TABS: 5.0000 mg | ORAL_TABLET | ORAL | 0 refills | Status: DC | PRN

## 2023-03-17 MED ORDER — ACETAMINOPHEN 325 MG PO TABS: 650.0000 mg | ORAL_TABLET | Freq: Four times a day (QID) | ORAL | 0 refills | Status: AC | PRN

## 2023-03-17 MED ORDER — NIFEDIPINE ER 30 MG PO TB24: 30.0000 mg | ORAL_TABLET | Freq: Every day | ORAL | 1 refills | Status: AC

## 2023-03-17 MED ORDER — IBUPROFEN 600 MG PO TABS: 600.0000 mg | ORAL_TABLET | Freq: Four times a day (QID) | ORAL | 0 refills | Status: AC

## 2023-03-17 MED ORDER — OXYCODONE HCL 5 MG PO TABS: 5.0000 mg | ORAL_TABLET | ORAL | 0 refills | Status: AC | PRN

## 2023-03-17 NOTE — Progress Notes (Signed)
Postpartum Progress Note  Postpartum Day 2 s/p repeat Cesarean section at 37 weeks for gestational hypertension  Subjective:  Patient reports no overnight events.  She reports well controlled pain, ambulating without difficulty, voiding spontaneously, tolerating PO.  She reports Positive flatus, Negative BM.  Vaginal bleeding is minimal.  Objective: Blood pressure 121/76, pulse 69, temperature 97.6 F (36.4 C), temperature source Oral, resp. rate 18, height 5\' 7"  (1.702 m), weight 107.2 kg, SpO2 98%, unknown if currently breastfeeding.  Physical Exam:  General: alert and no distress Lochia: appropriate Abdomen: soft, ATTP Uterine Fundus: firm Incision: dressing in place DVT Evaluation: No evidence of DVT seen on physical exam.  Recent Labs    03/16/23 0445  HGB 11.1*  HCT 31.4*    Assessment/Plan: Postpartum Day 2, s/p C-section gHTN: BP improved with procardia XL 30 mg at bedtime, will continue  Lactation following Doing well, continue routine postpartum care. Anticipate discharge today with plans for BP check in office.    LOS: 2 days   Lyn Henri 03/17/2023, 7:44 AM

## 2023-03-17 NOTE — Discharge Summary (Signed)
Obstetric Discharge Summary  Joyce Massey is a 32 y.o. female that presented on 03/15/2023 for scheduled repeat C section at 37 weeks for gestational hypertension, history of prior C section. She delivered a viable female infant on 03/15/2023. She was started on procardia XL 30 mg daily for elevated blood pressure. Her postpartum course was otherwise uncomplicated and on PPD#2, she reported well controlled pain, spontaneous voiding, ambulating without difficulty, and tolerating PO.  She was stable for discharge home on 03/17/23 with plans for in-office follow up.  Hemoglobin  Date Value Ref Range Status  03/16/2023 11.1 (L) 12.0 - 15.0 g/dL Final   HCT  Date Value Ref Range Status  03/16/2023 31.4 (L) 36.0 - 46.0 % Final    Physical Exam:  General: alert and no distress Lochia: appropriate Uterine Fundus: firm Incision: healing well DVT Evaluation: No evidence of DVT seen on physical exam.  Discharge Diagnoses: Term pregnancy, delivred. Gestational hypertension.  Discharge Information: Date: 03/17/2023 Activity: Pelvic rest, as tolerated Diet: routine Medications: Tylenol, motrin, oxycodone, procardia XL Condition: stable Instructions: Refer to practice specific booklet.  Discussed prior to discharge.  Discharge to: Home  Follow-up Information     [facility] Follow up.   Why: Please follow up for a 1 week blood pressure check and incision check. Contact information: [address] [place] [address] [phone]                 Newborn Data: Live born female  Birth Weight: 8 lb 11.3 oz (3950 g) APGAR: 7, 8  Newborn Delivery   Birth date/time: 03/15/2023 08:30:00 Delivery type: C-Section, Low Transverse Trial of labor: No C-section categorization: Repeat     Home with mother.  Lyn Henri 03/17/2023, 7:22 PM

## 2023-03-22 ENCOUNTER — Other Ambulatory Visit (HOSPITAL_COMMUNITY): Payer: Self-pay | Admitting: Obstetrics and Gynecology

## 2023-03-22 ENCOUNTER — Ambulatory Visit (HOSPITAL_COMMUNITY)
Admission: RE | Admit: 2023-03-22 | Discharge: 2023-03-22 | Disposition: A | Discharge status: Home / Self Care | Payer: 59 | Source: Ambulatory Visit | Attending: Obstetrics and Gynecology | Admitting: Obstetrics and Gynecology

## 2023-03-22 DIAGNOSIS — M79605 Pain in left leg: Secondary | ICD-10-CM

## 2023-04-03 ENCOUNTER — Telehealth (HOSPITAL_COMMUNITY): Payer: Self-pay

## 2023-04-03 NOTE — Telephone Encounter (Signed)
04/03/2023 1200  Name: Joyce Massey MRN: [id number] DOB: 03-Oct-1990  Reason for Call:  Transition of Care Hospital Discharge Call  Contact Status: Patient Contact Status: Message  Language assistant needed: Interpreter Mode: Interpreter Not Needed        Follow-Up Questions:    Edinburgh Postnatal Depression Scale:  In the Past 7 Days:    PHQ2-9 Depression Scale:     Discharge Follow-up:    Post-discharge interventions: NA  Signature  Signe Colt

## 2024-04-28 ENCOUNTER — Ambulatory Visit: Admission: EM | Admit: 2024-04-28 | Discharge: 2024-04-28 | Disposition: A | Discharge status: Home / Self Care

## 2024-04-28 ENCOUNTER — Other Ambulatory Visit: Payer: Self-pay

## 2024-04-28 ENCOUNTER — Encounter: Payer: Self-pay | Admitting: Emergency Medicine

## 2024-04-28 DIAGNOSIS — S161XXA Strain of muscle, fascia and tendon at neck level, initial encounter: Secondary | ICD-10-CM

## 2024-04-28 MED ORDER — METHOCARBAMOL 500 MG PO TABS: 500.0000 mg | ORAL_TABLET | Freq: Two times a day (BID) | ORAL | 0 refills | Status: AC

## 2024-04-28 NOTE — Discharge Instructions (Addendum)

## 2024-04-28 NOTE — ED Triage Notes (Signed)
 Pt here for neck pain x 4 days after waking from sleep; pt sts increased pain into right shoulder; pt sts started steroids yesterday but still having severe pain

## 2024-04-28 NOTE — ED Provider Notes (Signed)
 [facility]    CSN: [id number] Arrival date & time: 04/28/24  1016      History   Chief Complaint Chief Complaint  Patient presents with   Neck Pain    HPI Joyce Massey is a 33 y.o. female.   Patient presents today due to right sided pain that radiates down her back and around her scapula for the last 3 days.  Patient states that she has been using ibuprofen , Tylenol , heat, and prednisone that was prescribed to her during a televisit yesterday with no relief.  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  The history is provided by the patient.  Neck Pain   Past Medical History:  Diagnosis Date   ADHD (attention deficit hyperactivity disorder)    Anxiety    Eczema    Endometriosis, mild    GERD (gastroesophageal reflux disease)    History of gastric ulcer    History of pre-eclampsia in prior pregnancy, currently pregnant    Ovarian cyst    Pelvic pain     Patient Active Problem List   Diagnosis Date Noted   Previous cesarean section 03/15/2023   S/P cesarean section 03/15/2023   Pregnancy 01/29/2021   Abnormal uterine bleeding 01/26/2021   Endometriosis determined by laparoscopy 07/12/2016   Acute pain in female pelvis 07/11/2016   Left ovarian cyst 07/11/2016   Eczema 06/21/2016   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   LAPAROSCOPIC OVARIAN CYSTECTOMY Left  07/12/2016   Procedure: OPERATIVE LAPAROSCOPIC OVARIAN CYSTECTOMY AND PERITONEAL BIOPSY;  Surgeon: Shanda SHAUNNA Muscat, MD;  Location: [facility];  Service: Gynecology;  Laterality: Left;   WISDOM TOOTH EXTRACTION      OB History     Gravida  3   Para  2   Term  2   Preterm  0   AB  1   Living  2      SAB  0   IAB  0   Ectopic  1   Multiple  0   Live Births  2        Obstetric Comments  Ectopic treated with Methotrexate           Home Medications    Prior to Admission medications   Medication Sig Start Date End Date Taking? Authorizing Provider  methocarbamol  (ROBAXIN ) 500 MG tablet Take 1 tablet (500 mg total) by mouth 2 (two) times daily. 04/28/24  Yes Andra Corean BROCKS, PA-C  acetaminophen  (TYLENOL ) 325 MG tablet Take 2 tablets (650 mg total) by mouth every 6 (six) hours as needed. 03/17/23   Lequita Evalene LABOR, MD  famotidine  (PEPCID ) 20 MG tablet Take 20 mg by mouth 2 (two) times daily.    [provider]  fluticasone  (FLONASE ) 50 MCG/ACT nasal spray Place 1 spray into both nostrils daily as needed for allergies.    [provider]  ibuprofen  (ADVIL ) 600 MG tablet Take 1 tablet (600 mg total) by mouth 4 (four) times daily. 03/17/23   Lequita Evalene LABOR, MD  NIFEdipine  (ADALAT  CC) 30 MG 24 hr tablet Take 1 tablet (30 mg total) by mouth at bedtime. 03/17/23   Lequita Evalene LABOR, MD  Prenatal Vit-Fe Fumarate-FA (PRENATAL VITAMIN PO) Take 2 tablets by mouth daily.    [provider]  sertraline  (ZOLOFT ) 50 MG tablet Take 75 mg by mouth daily.    [provider]    Family History Family History  Problem Relation Age of Onset   Healthy Mother    Hypertension Father    Heart disease Maternal Grandmother     Social History Social History   Tobacco Use   Smoking status: Former    Current packs/day: 0.00    Average packs/day: 0.5 packs/day for 5.0 years (2.5 ttl pk-yrs)    Types: Cigarettes    Start  date: 07/09/2009    Quit date: 07/09/2014    Years since quitting: 9.8   Smokeless tobacco: Never  Vaping Use   Vaping status: Never Used  Substance Use Topics   Alcohol use: Not Currently    Alcohol/week: 2.0 standard drinks of alcohol    Types: 2 Standard drinks or equivalent per week    Comment: occasional   Drug use: No     Allergies   Wasp venom protein, Latex, and Tape   Review of Systems Review of Systems  Musculoskeletal:  Positive for neck pain.     Physical Exam Triage Vital Signs ED Triage Vitals  Encounter Vitals Group     BP 04/28/24 1036 (!) 148/97     Girls Systolic BP Percentile --      Girls Diastolic BP Percentile --      Boys Systolic BP Percentile --      Boys Diastolic BP Percentile --      Pulse Rate 04/28/24 1036 78     Resp 04/28/24 1036 18     Temp 04/28/24 1036 98.6 F (37 C)     Temp Source 04/28/24 1036 Oral     SpO2 04/28/24 1036 97 %     Weight --      Height --      Head Circumference --      Peak Flow --      Pain Score 04/28/24 1037 8     Pain Loc --      Pain Education --      Exclude from Growth Chart --    No data found.  Updated Vital Signs BP (!) 148/97 (BP Location: Left Arm)   Pulse 78   Temp 98.6 F (37 C) (Oral)   Resp 18   SpO2 97%   Visual Acuity Right Eye Distance:   Left Eye Distance:   Bilateral Distance:    Right Eye Near:   Left Eye Near:    Bilateral Near:     Physical Exam Vitals and nursing note reviewed.  Constitutional:      General: She is not in acute distress.    Appearance: Normal appearance. She is not ill-appearing, toxic-appearing or diaphoretic.  Eyes:     General: No scleral icterus. Cardiovascular:     Rate and Rhythm: Normal rate and regular rhythm.     Heart sounds: Normal heart sounds.  Pulmonary:     Effort: Pulmonary effort is normal. No respiratory distress.     Breath sounds: Normal breath sounds. No  wheezing or rhonchi.  Musculoskeletal:       Arms:     Comments:  Tenderness to palpation where indicated on diagram  Skin:    General: Skin is warm.  Neurological:     Mental Status: She is alert and oriented to person, place, and time.  Psychiatric:        Mood and Affect: Mood normal.        Behavior: Behavior normal.      UC Treatments / Results  Labs (all labs ordered are listed, but only abnormal results are displayed) Labs Reviewed - No data to display  EKG   Radiology No results found.  Procedures Procedures (including critical care time)  Medications Ordered in UC Medications - No data to display  Initial Impression / Assessment and Plan / UC Course  I have reviewed the triage vital signs and the nursing notes.  Pertinent labs & imaging results that were available during my care of the patient were reviewed by me and considered in my medical decision making (see chart for details).    Final Clinical Impressions(s) / UC Diagnoses   Final diagnoses:  Neck strain, initial encounter     Discharge Instructions      Today you have been diagnosed with a musculoskeletal injury.  You should use ice on affected area for 20 minutes at a time a couple times a day for the first 24 hours then you may switch to heat in the same intervals.  Be sure to put a barrier between ice or heat source and skin to prevent burns.  May also wrap affected area and Ace bandage if tolerated and appropriate, and elevate above the level of the heart to help reduce swelling.  Do not wrap Ace bandages around neck or torso as wrapping too tight can restrict air movement inability to breathe.  If symptoms do not seem to be improving in 3 to 5 days after following these instructions we need to follow-up with orthopedist or PCP.     ED Prescriptions     Medication Sig Dispense Auth. Provider   methocarbamol  (ROBAXIN ) 500 MG tablet Take 1 tablet (500 mg total) by mouth 2 (two) times daily. 20 tablet Andra Corean BROCKS, PA-C      PDMP not reviewed this  encounter.   Andra Corean BROCKS, PA-C 04/28/24 1117
